# Patient Record
Sex: Male | Born: 1990 | Race: Black or African American | Hispanic: No | Marital: Single | Smoking: Current every day smoker
Health system: Southern US, Community
[De-identification: ages and names within clinical notes are randomized; demographics above are authoritative.]

---

## 2013-12-14 LAB — COMPREHENSIVE METABOLIC PANEL
ALBUMIN: 4.3 g/dL (ref 3.4–5.0)
ALK PHOS: 54 U/L
Anion Gap: 4 — ABNORMAL LOW (ref 7–16)
BILIRUBIN TOTAL: 1.2 mg/dL — AB (ref 0.2–1.0)
BUN: 10 mg/dL (ref 7–18)
CALCIUM: 9.2 mg/dL (ref 8.5–10.1)
CO2: 29 mmol/L (ref 21–32)
Chloride: 107 mmol/L (ref 98–107)
Creatinine: 0.83 mg/dL (ref 0.60–1.30)
EGFR (Non-African Amer.): >60
Glucose: 115 mg/dL — ABNORMAL HIGH (ref 65–99)
OSMOLALITY: 279 (ref 275–301)
Potassium: 3.6 mmol/L (ref 3.5–5.1)
SGOT(AST): 18 U/L (ref 15–37)
SGPT (ALT): 25 U/L (ref 12–78)
Sodium: 140 mmol/L (ref 136–145)
TOTAL PROTEIN: 8.1 g/dL (ref 6.4–8.2)

## 2013-12-14 LAB — CBC
HCT: 42.6 % (ref 40.0–52.0)
HGB: 13.9 g/dL (ref 13.0–18.0)
MCH: 29.9 pg (ref 26.0–34.0)
MCHC: 32.7 g/dL (ref 32.0–36.0)
MCV: 91 fL (ref 80–100)
Platelet: 192 10*3/uL (ref 150–440)
RBC: 4.66 10*6/uL (ref 4.40–5.90)
RDW: 12.2 % (ref 11.5–14.5)
WBC: 10.2 10*3/uL (ref 3.8–10.6)

## 2013-12-14 LAB — ETHANOL
Ethanol %: <0.003 % (ref 0.000–0.080)
Ethanol: <3 mg/dL

## 2013-12-14 LAB — SALICYLATE LEVEL: Salicylates, Serum: <1.7 mg/dL

## 2013-12-14 LAB — ACETAMINOPHEN LEVEL: Acetaminophen: <2 ug/mL

## 2013-12-15 ENCOUNTER — Inpatient Hospital Stay: Payer: Self-pay | Admitting: Psychiatry

## 2013-12-15 LAB — URINALYSIS, COMPLETE
BACTERIA: NONE SEEN
Bilirubin,UR: NEGATIVE
Blood: NEGATIVE
GLUCOSE, UR: NEGATIVE mg/dL (ref 0–75)
KETONE: NEGATIVE
Leukocyte Esterase: NEGATIVE
Nitrite: NEGATIVE
Ph: 5 (ref 4.5–8.0)
Protein: NEGATIVE
Specific Gravity: 1.029 (ref 1.003–1.030)
Squamous Epithelial: <1

## 2013-12-15 LAB — DRUG SCREEN, URINE
Amphetamines, Ur Screen: NEGATIVE (ref ?–1000)
BARBITURATES, UR SCREEN: NEGATIVE (ref ?–200)
BENZODIAZEPINE, UR SCRN: NEGATIVE (ref ?–200)
COCAINE METABOLITE, UR ~~LOC~~: NEGATIVE (ref ?–300)
Cannabinoid 50 Ng, Ur ~~LOC~~: NEGATIVE (ref ?–50)
MDMA (ECSTASY) UR SCREEN: NEGATIVE (ref ?–500)
Methadone, Ur Screen: NEGATIVE (ref ?–300)
Opiate, Ur Screen: NEGATIVE (ref ?–300)
PHENCYCLIDINE (PCP) UR S: NEGATIVE (ref ?–25)
Tricyclic, Ur Screen: NEGATIVE (ref ?–1000)

## 2014-12-13 NOTE — H&P (Signed)
PATIENT NAMTacy Medina:  Medina, [name] MR#:  [id number] DATE OF BIRTH:  July 04, 1991  DATE OF ADMISSION:  12/15/2013  REFERRING PHYSICIAN: Emergency Room MD  ATTENDING PHYSICIAN: Kristine LineaJolanta Shaine Newmark, MD   IDENTIFYING DATA: [name] is a 24 year old male with no past psychiatric history.   CHIEF COMPLAINT: "I was upset."   HISTORY OF PRESENT ILLNESS: [name] came to the Emergency Room upset after an argument with his girlfriend when he was thinking of suicide. He did not have a specific plan but was unable to contact for safety, rather upset and hopeless, worthless, guilty. He reports that he has been with his girlfriend for 3 years. They have been arguing on and off. Two weeks ago following an argument with the girlfriend he left [place] and wants to [place] to stay with his mother where he feels supported. As a result he lost his job, and is unemployed now. He has been arguing with his girlfriend even more. He used to give her $150 of her paycheck every 2 weeks now. Now he has no money to pay the pills or support the girl. She reportedly is very jealous of any conversations that he may have with other women. He says he has been faithful. He reports that his whole family is probably bipolar and he experiences mood swings from day to day, unexpected anger, impulsive behavior, hyperactivity, racing thoughts, difficulties falling asleep, excessive worries. He has been thinking that he might be bipolar and would gladly welcome medications to address his mood swings. He has some symptoms of depression with decreased sleep, anhedonia, feeling of guilt, hopelessness, worthlessness, and now suicidal ideation. He denies any problems with memory, concentration, or energy. He denies psychotic symptoms. He denies symptoms suggestive of bipolar mild anemia. He denies alcohol or illicit substance use.   PAST PSYCHIATRIC HISTORY: None reported. No hospitalizations treatment or suicide attempts. No history of substance  use.    FAMILY PSYCHIATRIC HISTORY: Sister and brother with depression, many people in his opinion with bipolar disorder.   PAST MEDICAL HISTORY: None.   ALLERGIES: No known drug allergies.   MEDICATIONS ON ADMISSION: None.   SOCIAL HISTORY: He dropped out of school in tenth grade, works through a [employer] agency. He lives with his brother and they are dating sisters so all 4 of them stays in an apartment. Usually they have a good relationship lately the patient has been losing his composure. He does not have a GED, but would like to go back to school. His major support is his mother in [place].   REVIEW OF SYSTEMS:  CONSTITUTIONAL: No fevers or chills. No weight changes.  EYES: No double or blurred vision.  ENT: No hearing loss.  RESPIRATORY: No shortness of breath or cough.  CARDIOVASCULAR: No chest pain or orthopnea.  GASTROINTESTINAL: No abdominal pain, nausea, vomiting, or diarrhea.  GENITOURINARY: No incontinence or frequency.  ENDOCRINE: No heat or cold intolerance.  LYMPHATIC: No anemia or easy bruising.  INTEGUMENTARY: No acne or rash.  MUSCULOSKELETAL: No muscle or joint pain.  NEUROLOGIC: No tingling or weakness.  PSYCHIATRIC: See history of present illness for details.   PHYSICAL EXAMINATION:  VITAL SIGNS: Blood pressure 123/79, pulse 49, respirations 18, temperature 98.3.  GENERAL: This is a well-developed young male in no acute distress.  HEENT: The pupils are equal, round, and reactive to light. Sclerae anicteric.  NECK: Supple. No thyromegaly.  LUNGS: Clear to auscultation. No dullness to percussion.  HEART: Regular rhythm and rate. No murmurs, rubs, or gallops.  ABDOMEN: Soft, nontender, nondistended. Positive bowel sounds.  MUSCULOSKELETAL: Normal muscle strength in all extremities.  SKIN: No rashes or bruises.  LYMPHATIC: No cervical adenopathy.  NEUROLOGIC: Cranial nerves II-XII are intact.   LABORATORY DATA: Chemistries are within normal limits with glucose  of 115. Blood and alcohol level is zero. LFTs within normal limits except for total bilirubin of 1.2. Urine tox screen negative for substances. CBC within normal limits. Urinalysis is not suggestive of urinary tract infection. Serum acetaminophen and salicylate are low.   MENTAL STATUS EXAMINATION ON ADMISSION: The patient is alert and oriented to person, place, time, and situation. He is pleasant, polite, and cooperative. He is well groomed and casually dressed. He maintains good eye contact. His speech is of normal rhythm, rate, and volume. He is slightly tearful talking about his troubles. His mood is depressed with a sad affect. Thought process is logical and goal oriented. Thought content: He still had passing thoughts of suicide, but is able to contract for safety. He denies thoughts of hurting others. There are no delusions or paranoia. There are no auditory or visual hallucinations. His cognition is grossly intact. His registration, recall, short and long-term memory and are intact. He is of average intelligence and normal fund of knowledge. His insight and judgment are fair. He is a good historian. Suicide risk assessment on admission: This is a patient with no past psychiatric history, but some symptoms suggestive of mood instability, who became suicidal in the context of relationship problems and job loss. He is at increased risk of suicide.   INITIAL DIAGNOSES:    AXIS I: Bipolar disorder, not otherwise specified.   AXIS II: Deferred.   AXIS III: Deferred.   AXIS IV: Employment, financial, relationship, access to care.   AXIS V: Global assessment of functioning 25.   PLAN: The patient was admitted to [facility] Medicine unit for safety, stabilization, and medication management. He was initially placed on suicide precautions and was closely monitored for any unsafe behaviors. He underwent full psychiatric and risk assessment. He received pharmacotherapy,  individual and group psychotherapy, substance abuse counseling, and support from therapeutic milieu.  1. Suicidal ideation: The patient is able to contract for safety,  2. Mood: We will start Tegretol for mood stabilization and Prozac for depression. Trazodone will be added for sleep.  3. Disposition: The patient will be discharged to home. He will likely follow up with [facility] for and medication management and therapy.    ____________________________ Ellin Goodie. Jennet Maduro, MD jbp:lt D: 12/15/2013 20:33:36 ET T: 12/15/2013 23:43:43 ET JOB#: [id number]  cc: Natash Berman B. Jennet Maduro, MD, <Dictator> Shari Prows MD ELECTRONICALLY SIGNED 12/18/2013 23:52

## 2015-05-12 ENCOUNTER — Encounter: Payer: Self-pay | Admitting: Emergency Medicine

## 2015-05-12 ENCOUNTER — Emergency Department
Admission: EM | Admit: 2015-05-12 | Discharge: 2015-05-12 | Disposition: A | Discharge status: Home / Self Care | Payer: Self-pay | Attending: Emergency Medicine | Admitting: Emergency Medicine

## 2015-05-12 ENCOUNTER — Emergency Department: Payer: Self-pay

## 2015-05-12 DIAGNOSIS — Z87891 Personal history of nicotine dependence: Secondary | ICD-10-CM | POA: Insufficient documentation

## 2015-05-12 DIAGNOSIS — K047 Periapical abscess without sinus: Secondary | ICD-10-CM | POA: Insufficient documentation

## 2015-05-12 LAB — CBC WITH DIFFERENTIAL/PLATELET
Basophils Absolute: 0.1 10*3/uL (ref 0–0.1)
Basophils Relative: 1 %
Eosinophils Absolute: 0.3 10*3/uL (ref 0–0.7)
Eosinophils Relative: 3 %
HEMATOCRIT: 42.3 % (ref 40.0–52.0)
HEMOGLOBIN: 14.1 g/dL (ref 13.0–18.0)
LYMPHS ABS: 2.4 10*3/uL (ref 1.0–3.6)
LYMPHS PCT: 22 %
MCH: 30.6 pg (ref 26.0–34.0)
MCHC: 33.3 g/dL (ref 32.0–36.0)
MCV: 91.8 fL (ref 80.0–100.0)
MONOS PCT: 9 %
Monocytes Absolute: 1 10*3/uL (ref 0.2–1.0)
NEUTROS ABS: 7.2 10*3/uL — AB (ref 1.4–6.5)
Neutrophils Relative %: 65 %
Platelets: 167 10*3/uL (ref 150–440)
RBC: 4.61 MIL/uL (ref 4.40–5.90)
RDW: 12.4 % (ref 11.5–14.5)
WBC: 11 10*3/uL — ABNORMAL HIGH (ref 3.8–10.6)

## 2015-05-12 LAB — BASIC METABOLIC PANEL
ANION GAP: 7 (ref 5–15)
BUN: 12 mg/dL (ref 6–20)
CHLORIDE: 102 mmol/L (ref 101–111)
CO2: 28 mmol/L (ref 22–32)
Calcium: 9.3 mg/dL (ref 8.9–10.3)
Creatinine, Ser: 1.22 mg/dL (ref 0.61–1.24)
GFR calc Af Amer: >60 mL/min (ref >60)
GFR calc non Af Amer: >60 mL/min (ref >60)
GLUCOSE: 90 mg/dL (ref 65–99)
Potassium: 4.1 mmol/L (ref 3.5–5.1)
Sodium: 137 mmol/L (ref 135–145)

## 2015-05-12 MED ORDER — IBUPROFEN 800 MG PO TABS: 800.0000 mg | ORAL_TABLET | Freq: Three times a day (TID) | ORAL | Status: DC | PRN

## 2015-05-12 MED ORDER — TRAMADOL HCL 50 MG PO TABS: 50.0000 mg | ORAL_TABLET | Freq: Four times a day (QID) | ORAL | Status: AC | PRN

## 2015-05-12 MED ORDER — IOHEXOL 300 MG/ML  SOLN
75.0000 mL | Freq: Once | INTRAMUSCULAR | Status: AC | PRN
  Administered 2015-05-12: 75 mL via INTRAVENOUS

## 2015-05-12 MED ORDER — IBUPROFEN 800 MG PO TABS
800.0000 mg | ORAL_TABLET | Freq: Once | ORAL | Status: AC
  Administered 2015-05-12: 800 mg via ORAL
  Filled 2015-05-12: qty 1

## 2015-05-12 MED ORDER — AMOXICILLIN 500 MG PO CAPS: 500.0000 mg | ORAL_CAPSULE | Freq: Three times a day (TID) | ORAL | Status: DC

## 2015-05-12 MED ORDER — OXYCODONE-ACETAMINOPHEN 5-325 MG PO TABS
1.0000 | ORAL_TABLET | Freq: Once | ORAL | Status: AC
  Administered 2015-05-12: 1 via ORAL
  Filled 2015-05-12: qty 1

## 2015-05-12 MED ORDER — AMOXICILLIN 500 MG PO CAPS
500.0000 mg | ORAL_CAPSULE | Freq: Once | ORAL | Status: AC
  Administered 2015-05-12: 500 mg via ORAL
  Filled 2015-05-12: qty 1

## 2015-05-12 NOTE — Discharge Instructions (Signed)
Follow up with dental clinic from list provided. OPTIONS FOR DENTAL FOLLOW UP CARE  [place] Department of Health and Human Services - Local Safety Net Dental Clinics [email or web address]   [facility] [phone])  Sharl Ma [phone])  [place] [phone])  [facility] [phone])  [facility] [phone]) This clinic caters to the indigent population and is on a lottery system. Location: [employer] of Dentistry, [employer], [address] [place] Clinic Hours: Wednesdays from 6pm - 9pm, patients seen by a lottery system. For dates, call or go to [email or web address] Services: Cleanings, fillings and simple extractions. Payment Options: DENTAL WORK IS FREE OF CHARGE. Bring proof of income or support. Best way to get seen: Arrive at 5:15 pm - this is a lottery, NOT first come/first serve, so arriving earlier will not increase your chances of being seen.     [facility] [phone] Select option 1 for emergencies   Location: [facility] of Dentistry, [place], [address] [place] Clinic Hours: No walk-ins accepted - call the day before to schedule an appointment. Check in times are 9:30 am and 1:30 pm. Services: Simple extractions, temporary fillings, pulpectomy/pulp debridement, uncomplicated abscess drainage. Payment Options: PAYMENT IS DUE AT THE TIME OF SERVICE.  Fee is usually $100-200, additional surgical procedures (e.g. abscess drainage) may be extra. Cash, checks, Visa/MasterCard accepted.  Can file Medicaid if patient is covered for dental - patient should call case worker to check. No discount for [facility] patients. Best way to get seen: MUST call the day before and get onto the schedule. Can usually be seen the next 1-2 days. No walk-ins accepted.      [facility] Services [phone]   Location: [facility], [address] [place] Clinic Hours: M, W, Th, F 8am or 1:30pm, Tues 9a or 1:30 - first come/first served. Services: Simple extractions, temporary fillings, uncomplicated abscess drainage.  You do not need to be an [facility] resident. Payment Options: PAYMENT IS DUE AT THE TIME OF SERVICE. Dental insurance, otherwise sliding scale - bring proof of income or support. Depending on income and treatment needed, cost is usually $50-200. Best way to get seen: Arrive early as it is first come/first served.     [facility] [phone]   Location: [address] Clinic Hours: Mon-Thu 8a-5p Services: Most basic dental services including extractions and fillings. Payment Options: PAYMENT IS DUE AT THE TIME OF SERVICE. Sliding scale, up to 50% off - bring proof if income or support. Medicaid with dental option accepted. Best way to get seen: Call to schedule an appointment, can usually be seen within 2 weeks OR they will try to see walk-ins - show up at 8a or 2p (you may have to wait).     [facility] [phone] [place]ID[place] ONLY   Location: [facility], [address], [place], [place] [address] Clinic Hours: By appointment only. Monday - Thursday 8am-5pm, Friday 8am-12pm Services: Cleanings, fillings, extractions. Payment Options: PAYMENT IS DUE AT THE TIME OF SERVICE. Cash, Visa or MasterCard. Sliding scale - $30 minimum per service. Best way to get seen: Come in to office, complete packet and make an appointment - need proof of income or support monies for each household member and proof of [facility] residence. Usually takes about a month to get in.     [facility] Dental  Clinic °[phone] °  °Location: °[address] [place] °Clinic Hours: Walk-in Urgent Care  Dental Services are offered Monday-Friday mornings only. °The numbers of emergencies accepted daily is limited to the number of °providers available. °Maximum 15 - Mondays, Wednesdays & Thursdays °Maximum 10 - Tuesdays & Fridays °Services: °You do not need to be a [place] resident to be seen for a dental emergency. °Emergencies are defined as pain, swelling, abnormal bleeding, or dental trauma. Walkins will receive x-rays if needed. °NOTE: Dental cleaning is not an emergency. °Payment Options: °PAYMENT IS DUE AT THE TIME OF SERVICE. °Minimum co-pay is $40.00 for uninsured patients. °Minimum co-pay is $3.00 for Medicaid with dental coverage. °Dental Insurance is accepted and must be presented at time of visit. °Medicare does not cover dental. °Forms of payment: Cash, credit card, checks. °Best way to get seen: °If not previously registered with the clinic, walk-in dental registration begins at 7:15 am and is on a first come/first serve basis. °If previously registered with the clinic, call to make an appointment. °  °  °[facility] °[phone] °[place] RESIDENTS ONLY °  °Location: °[address], [place], [place] °Clinic Hours: °Mon-Thu 10a-2p °Services: Extractions only! °Payment Options: °FREE (donations accepted) - bring proof of income or support °Best way to get seen: °Call and schedule an appointment OR come at 8am on the 1st Monday of every month (except for holidays) when it is first come/first served. °  °  °[facility] °[phone] °  °Location: °[address], [place] °Clinic Hours: °Friday mornings °Services, Payment Options, Best way to get seen: °Call for info ° °

## 2015-05-12 NOTE — ED Provider Notes (Signed)
[facility] Emergency Department Fronie Holstein Note  ____________________________________________  Time seen: Approximately 7:20 PM  I have reviewed the triage vital signs and the nursing notes.   HISTORY  Chief Complaint Facial Swelling    HPI [name] is a 24 y.o. male left facial edema acute onset this morning. Patient denies any dental pain. He denies any fever associated with this complaint. Patient on dialysis he has a lot of badteethbegan denies any dental pain. Patient also state he feels pressure in the left maxillary area. Denies any nasal discharge. measures taken for this complaint.   History reviewed. No pertinent past medical history.  There are no active problems to display for this patient.   History reviewed. No pertinent past surgical history.  No current outpatient prescriptions on file.  Allergies Review of patient's allergies indicates no known allergies.  No family history on file.  Social History Social History  Substance Use Topics  . Smoking status: Former Games developer  . Smokeless tobacco: None  . Alcohol Use: No    Review of Systems Constitutional: No fever/chills Eyes: No visual changes. ENT: No sore throat. Cardiovascular: Denies chest pain. Respiratory: Denies shortness of breath. Gastrointestinal: No abdominal pain.  No nausea, no vomiting.  No diarrhea.  No constipation. Genitourinary: Negative for dysuria. Musculoskeletal: Negative for back pain. Skin: Negative for rash. Left facial edema Neurological: Negative for headaches, focal weakness or numbness. 10-point ROS otherwise negative.  ____________________________________________   PHYSICAL EXAM:  VITAL SIGNS: ED Triage Vitals  Enc Vitals Group     BP 05/12/15 1833 136/86 mmHg     Pulse Rate 05/12/15 1833 70     Resp 05/12/15 1833 20     Temp 05/12/15 1833 98.7 F (37.1 C)     Temp Source 05/12/15 1833 Oral     SpO2 05/12/15 1833 98 %     Weight  05/12/15 1833 225 lb (102.059 kg)     Height 05/12/15 1833  (1.803 m)     Head Cir --      Peak Flow --      Pain Score --      Pain Loc --      Pain Edu? --      Excl. in GC? --     Constitutional: Alert and oriented. Well appearing and in no acute distress. Eyes: Conjunctivae are normal. PERRL. EOMI. Head: Atraumatic. Nose: No congestion/rhinnorhea. Mouth/Throat: Mucous membranes are moist.  Oropharynx non-erythematous. Neck: No stridor.   Hematological/Lymphatic/Immunilogical: No cervical lymphadenopathy. Cardiovascular: Normal rate, regular rhythm. Grossly normal heart sounds.  Good peripheral circulation. Respiratory: Normal respiratory effort.  No retractions. Lungs CTAB. Gastrointestinal: Soft and nontender. No distention. No abdominal bruits. No CVA tenderness. Musculoskeletal: No lower extremity tenderness nor edema.  No joint effusions. Neurologic:  Normal speech and language. No gross focal neurologic deficits are appreciated. No gait instability. Skin:  Skin is warm, dry and intact. No rash noted. Psychiatric: Mood and affect are normal. Speech and behavior are normal.  ____________________________________________   LABS (all labs ordered are listed, but only abnormal results are displayed)  Labs Reviewed  CBC WITH DIFFERENTIAL/PLATELET - Abnormal; Notable for the following:    WBC 11.0 (*)    Neutro Abs 7.2 (*)    All other components within normal limits  BASIC METABOLIC PANEL   ____________________________________________  EKG   ____________________________________________  RADIOLOGY  CT scan consistent with a dental abscess. ____________________________________________   PROCEDURES  Procedure(s) performed: None  Critical Care performed: No  ____________________________________________   INITIAL IMPRESSION / ASSESSMENT AND PLAN / ED COURSE  Pertinent labs & imaging results that were available during my care of the patient were reviewed  by me and considered in my medical decision making (see chart for details).  Discuss CT findings with patient consistent with a dental abscess. Patient was started on amoxicillin, ibuprofen, and tramadol. Patient advised follow-up with dental clinic from the list provided. ___________________OPTIONS FOR DENTAL FOLLOW UP CARE  [place] Department of Health and Human Services - Local Safety Net Dental Clinics [email or web address]   [facility] [phone])  Sharl Ma [phone])  [facility] [place] [phone])  [facility] [phone])  [facility] [phone]) This clinic caters to the indigent population and is on a lottery system. Location: [employer] [facility], [employer], [address], [place] Clinic Hours: Wednesdays from 6pm - 9pm, patients seen by a lottery system. For dates, call or go to [email or web address] Services: Cleanings, fillings and simple extractions. Payment Options: DENTAL WORK IS FREE OF CHARGE. Bring proof of income or support. Best way to get seen: Arrive at 5:15 pm - this is a lottery, NOT first come/first serve, so arriving earlier will not increase your chances of being seen.     [facility] [phone] Select option 1 for emergencies   Location: [facility] of Dentistry, [place], [address], [place] Clinic Hours: No walk-ins accepted - call the day before to schedule an appointment. Check in times are 9:30 am and 1:30 pm. Services: Simple extractions, temporary fillings, pulpectomy/pulp debridement, uncomplicated abscess drainage. Payment Options: PAYMENT IS DUE AT THE TIME OF SERVICE.  Fee is usually $100-200, additional surgical procedures (e.g. abscess drainage) may be extra. Cash, checks, Visa/MasterCard accepted.  Can file Medicaid if patient is covered  for dental - patient should call case worker to check. No discount for [facility] patients. Best way to get seen: MUST call the day before and get onto the schedule. Can usually be seen the next 1-2 days. No walk-ins accepted.     [facility] Services [phone]   Location: [facility], [address], [place] Clinic Hours: M, W, Th, F 8am or 1:30pm, Tues 9a or 1:30 - first come/first served. Services: Simple extractions, temporary fillings, uncomplicated abscess drainage.  You do not need to be an [facility] resident. Payment Options: PAYMENT IS DUE AT THE TIME OF SERVICE. Dental insurance, otherwise sliding scale - bring proof of income or support. Depending on income and treatment needed, cost is usually $50-200. Best way to get seen: Arrive early as it is first come/first served.     [facility] [phone]   Location: [address] Clinic Hours: Mon-Thu 8a-5p Services: Most basic dental services including extractions and fillings. Payment Options: PAYMENT IS DUE AT THE TIME OF SERVICE. Sliding scale, up to 50% off - bring proof if income or support. Medicaid with dental option accepted. Best way to get seen: Call to schedule an appointment, can usually be seen within 2 weeks OR they will try to see walk-ins - show up at 8a or 2p (you may have to wait).     [facility] [phone] [place]ID[place] ONLY   Location: [facility], [address], [place], [place] [address] Clinic Hours: By appointment only. Monday - Thursday 8am-5pm, Friday 8am-12pm Services: Cleanings, fillings, extractions. Payment Options: PAYMENT IS DUE AT  THE TIME OF SERVICE. Cash, Visa or MasterCard. Sliding scale - $30 minimum per service. Best way to get seen: Come in to office, complete packet and make an appointment - need proof of income or support monies for  each household member and proof of [facility] residence. Usually takes about a month to get in.     [facility] [phone]   Location: [address], [facility] Clinic Hours: Walk-in Urgent Care Dental Services are offered Monday-Friday mornings only. The numbers of emergencies accepted daily is limited to the number of providers available. Maximum 15 - Mondays, Wednesdays & Thursdays Maximum 10 - Tuesdays & Fridays Services: You do not need to be a [facility] resident to be seen for a dental emergency. Emergencies are defined as pain, swelling, abnormal bleeding, or dental trauma. Walkins will receive x-rays if needed. NOTE: Dental cleaning is not an emergency. Payment Options: PAYMENT IS DUE AT THE TIME OF SERVICE. Minimum co-pay is $40.00 for uninsured patients. Minimum co-pay is $3.00 for Medicaid with dental coverage. Dental Insurance is accepted and must be presented at time of visit. Medicare does not cover dental. Forms of payment: Cash, credit card, checks. Best way to get seen: If not previously registered with the clinic, walk-in dental registration begins at 7:15 am and is on a first come/first serve basis. If previously registered with the clinic, call to make an appointment.     [facility] [phone] [place] RESIDENTS ONLY   Location: [address], [place], [place] Clinic Hours: Mon-Thu 10a-2p Services: Extractions only! Payment Options: FREE (donations accepted) - bring proof of income or support Best way to get seen: Call and schedule an appointment OR come at 8am on the 1st Monday of every month (except for holidays) when it is first come/first served.     [facility] [phone]   Location: [address] [place], [place] Clinic Hours: Friday mornings Services, Payment Options, Best way to get seen: Call for info _________________________   FINAL CLINICAL IMPRESSION(S) / ED  DIAGNOSES  Final diagnoses:  Dental abscess      Joni Reining, PA-C 05/12/15 2224  Loleta Rose, MD 05/13/15 [phone]

## 2015-05-12 NOTE — ED Notes (Signed)
Woke up with facial swelling this am  denies any pain

## 2016-06-14 IMAGING — CT CT MAXILLOFACIAL W/ CM
3 series · 16 of 47 positions shown, 19 images · IV contrast (omnipaque)
Comparison: None.

CLINICAL DATA: 24-year-old male with left-sided facial swelling.

EXAM:
CT MAXILLOFACIAL WITH CONTRAST
TECHNIQUE: Multidetector CT imaging of the maxillofacial structures was
performed with intravenous contrast. Multiplanar CT image
reconstructions were also generated. A small metallic BB was placed
on the right temple in order to reliably differentiate right from
left.
CONTRAST:  75mL OMNIPAQUE IOHEXOL 300 MG/ML  SOLN

[Series 2: max soft · axial · 0.32mm/px · z∈[-186,-38]mm · 10 of 86 slices shown, 13 images]
[im 6/86  brain]
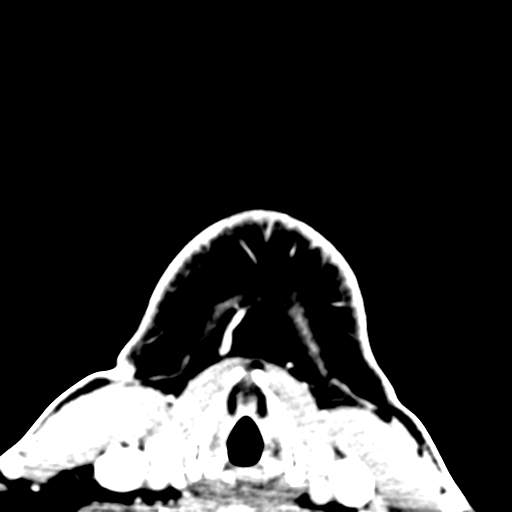
[im 6/86  bone]
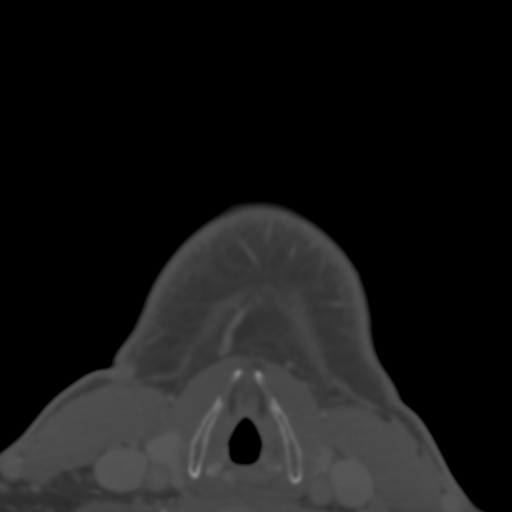
[im 15/86  bone]
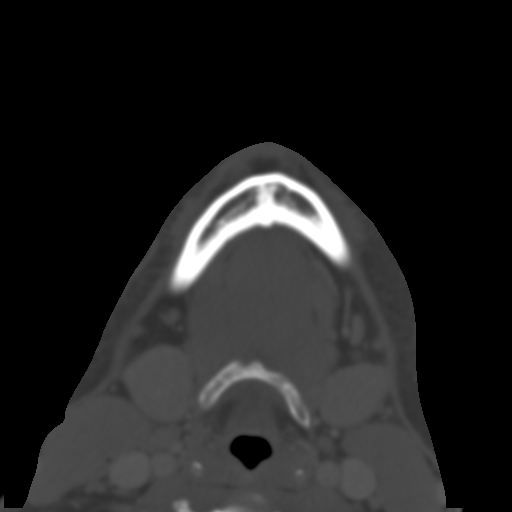
[im 24/86  bone]
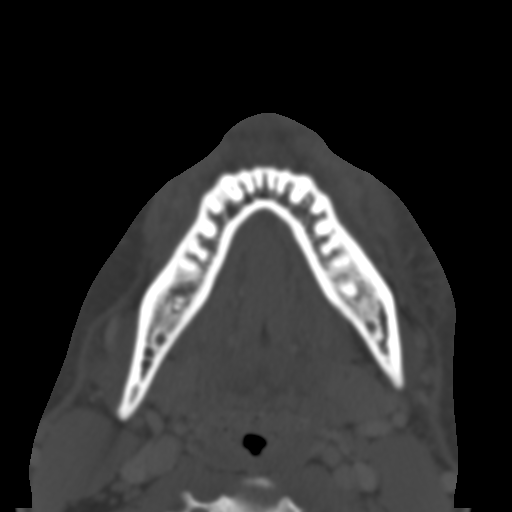
[im 30/86  bone]
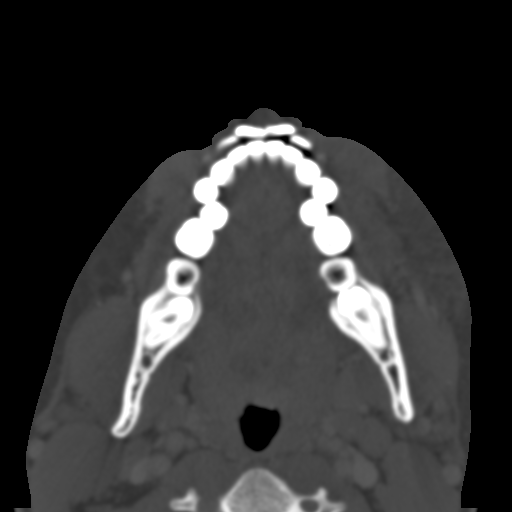
[im 39/86  brain]
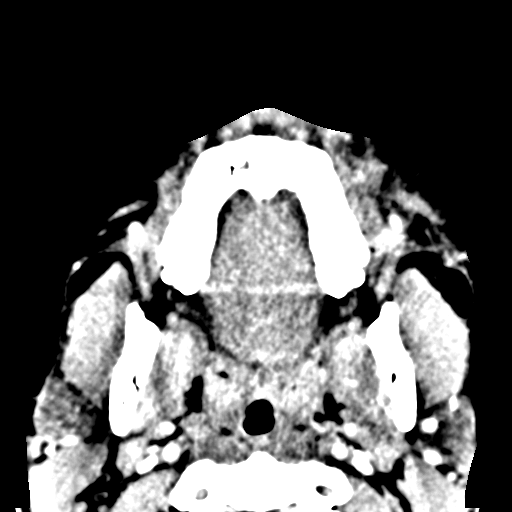
[im 39/86  bone]
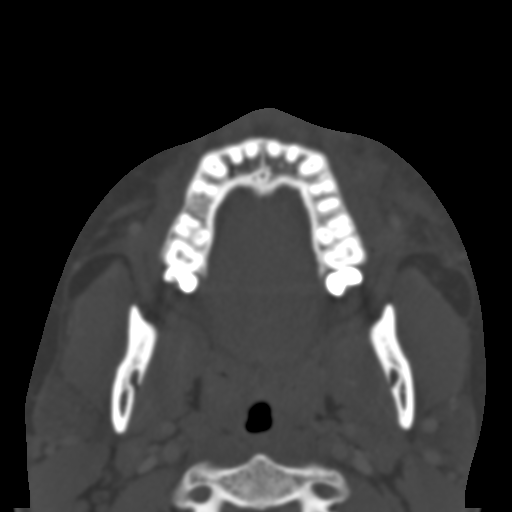
[im 47/86  bone]
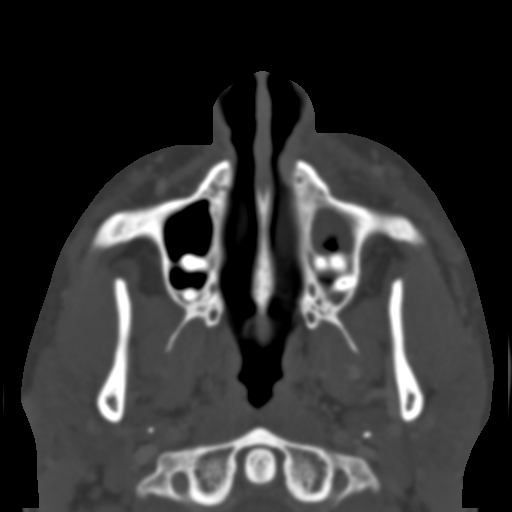
[im 56/86  bone]
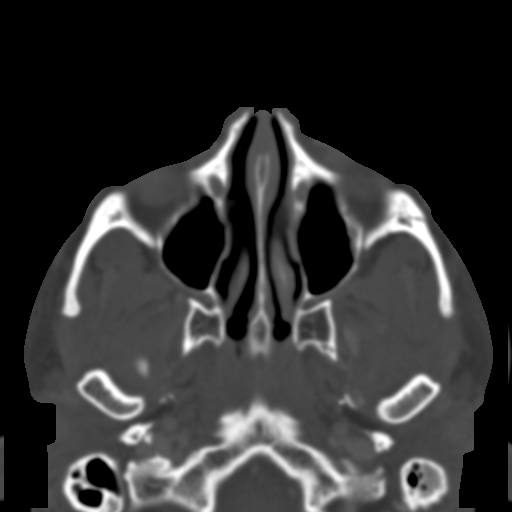
[im 65/86  bone]
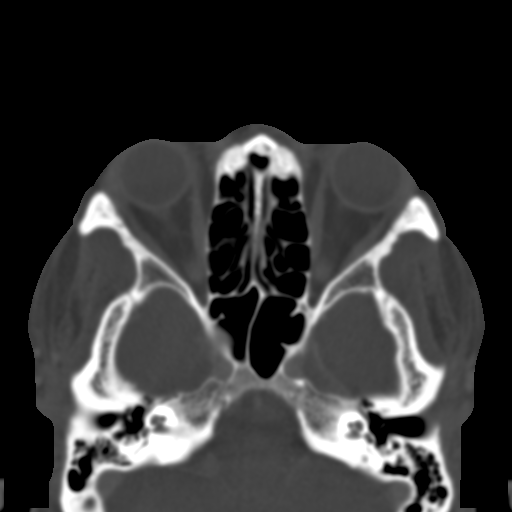
[im 71/86  brain]
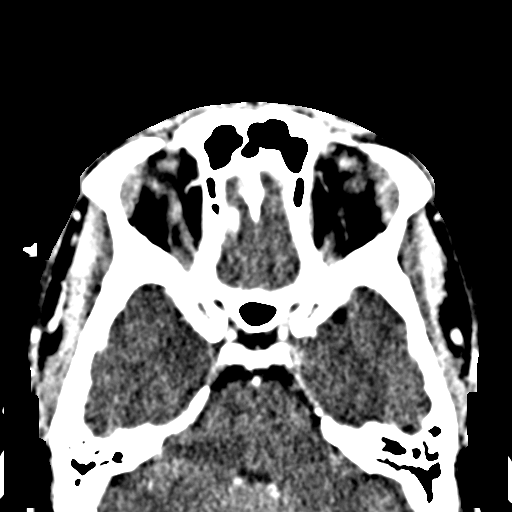
[im 71/86  bone]
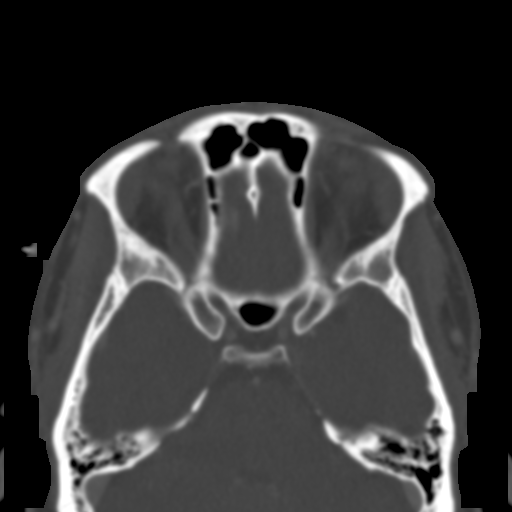
[im 80/86  bone]
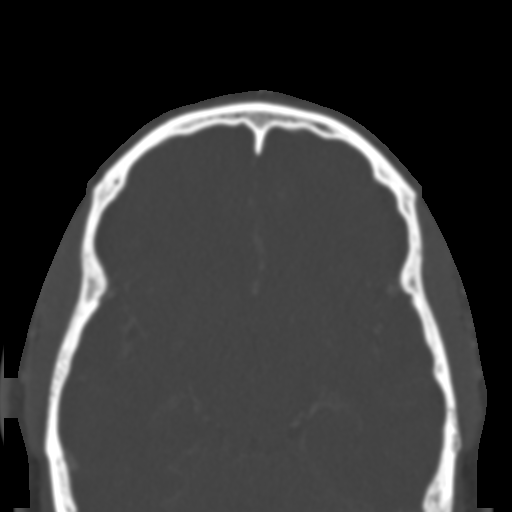

[Series 4: coronal soft · coronal · 0.35mm/px · 3 of 81 slices shown]
[im 27/81  bone]
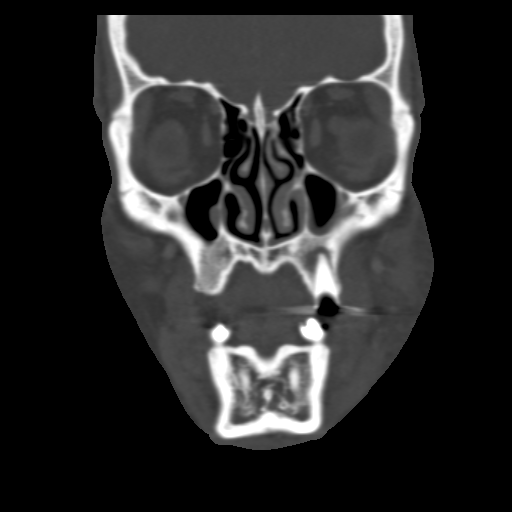
[im 36/81  bone]
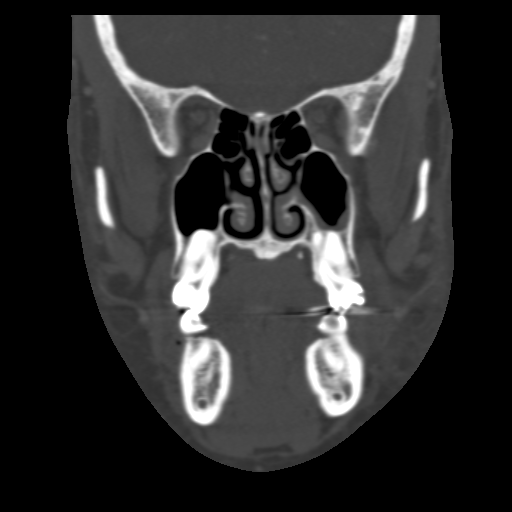
[im 45/81  bone]
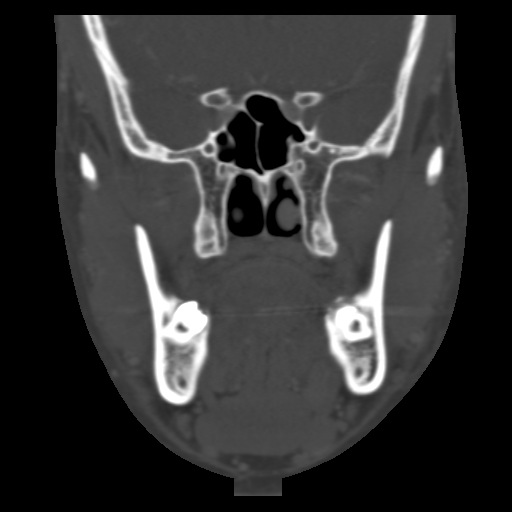

[Series 5: sagittal soft · sagittal · 0.35mm/px · 3 of 81 slices shown]
[im 27/81  bone]
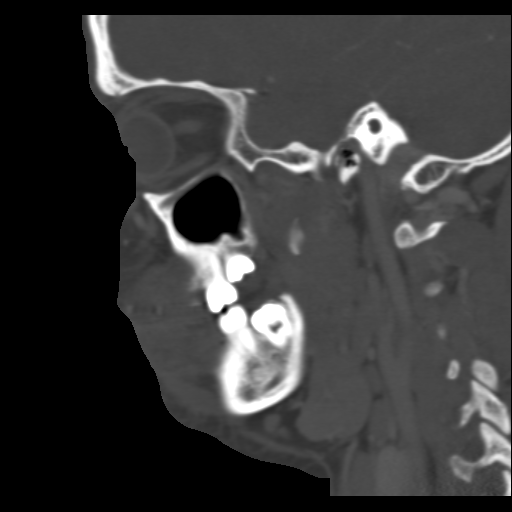
[im 41/81  bone]
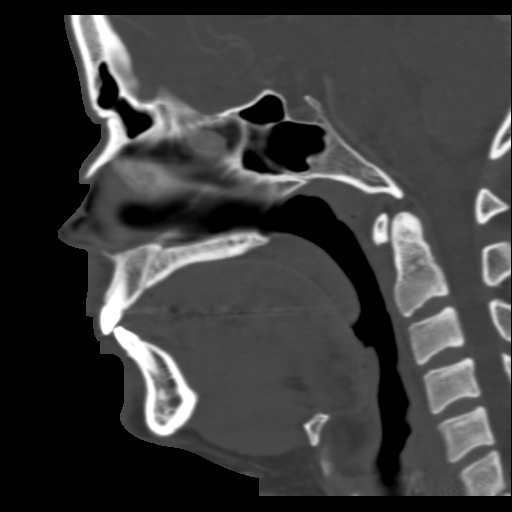
[im 54/81  bone]
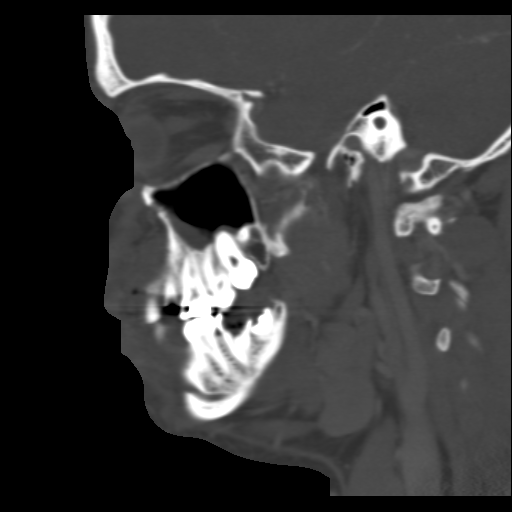

[16 of 47 positions shown; findings below may reference images not displayed]

FINDINGS: The visualized intracranial contents are unremarkable. The bilateral
globes and orbits are symmetric and unremarkable. There is mild skin
thickening and reticulation of the subcutaneous fat involving the
left cheek and underlying soft tissues. There is no associated
enhancing fluid collection to suggest abscess.

The visualized salivary glands are within normal limits.
Unremarkable visualized naso and oropharynx. Mild mucoperiosteal
thickening in the inferior aspect of the left maxillary antrum.

Large periapical lucency about the root of the carious left
maxillary second premolar. This may represent a source of
odontogenic infection. There are multiple carious teeth bilaterally.
IMPRESSION: 1. Skin thickening and reticulation of the underlying subcutaneous
fat and soft tissues of the left cheek and a GI all most consistent
with cellulitis. There is no associated abscess.
2. Carious left maxillary second premolar with large periapical
lucency consistent with periodontal disease. This may represent an
odontogenic source for the above described cellulitis.
3. Multiple carious teeth bilaterally.

## 2019-04-04 ENCOUNTER — Encounter: Payer: Self-pay | Admitting: Emergency Medicine

## 2019-04-04 ENCOUNTER — Other Ambulatory Visit: Payer: Self-pay

## 2019-04-04 ENCOUNTER — Emergency Department
Admission: EM | Admit: 2019-04-04 | Discharge: 2019-04-04 | Disposition: A | Discharge status: Home / Self Care | Payer: Self-pay | Attending: Emergency Medicine | Admitting: Emergency Medicine

## 2019-04-04 DIAGNOSIS — Z0189 Encounter for other specified special examinations: Secondary | ICD-10-CM | POA: Insufficient documentation

## 2019-04-04 DIAGNOSIS — Z7689 Persons encountering health services in other specified circumstances: Secondary | ICD-10-CM

## 2019-04-04 DIAGNOSIS — Z87891 Personal history of nicotine dependence: Secondary | ICD-10-CM | POA: Insufficient documentation

## 2019-04-04 DIAGNOSIS — L0231 Cutaneous abscess of buttock: Secondary | ICD-10-CM | POA: Insufficient documentation

## 2019-04-04 MED ORDER — SULFAMETHOXAZOLE-TRIMETHOPRIM 800-160 MG PO TABS
1.0000 | ORAL_TABLET | Freq: Once | ORAL | Status: AC
  Administered 2019-04-04: 1 via ORAL
  Filled 2019-04-04: qty 1

## 2019-04-04 MED ORDER — SULFAMETHOXAZOLE-TRIMETHOPRIM 800-160 MG PO TABS: 1.0000 | ORAL_TABLET | Freq: Two times a day (BID) | ORAL | 0 refills | Status: DC

## 2019-04-04 MED ORDER — LIDOCAINE HCL (PF) 1 % IJ SOLN
5.0000 mL | Freq: Once | INTRAMUSCULAR | Status: AC
  Administered 2019-04-04: 5 mL via INTRADERMAL
  Filled 2019-04-04: qty 5

## 2019-04-04 MED ORDER — HYDROCODONE-ACETAMINOPHEN 5-325 MG PO TABS: 1.0000 | ORAL_TABLET | Freq: Three times a day (TID) | ORAL | 0 refills | Status: AC | PRN

## 2019-04-04 MED ORDER — HYDROCODONE-ACETAMINOPHEN 5-325 MG PO TABS
1.0000 | ORAL_TABLET | Freq: Once | ORAL | Status: AC
  Administered 2019-04-04: 1 via ORAL
  Filled 2019-04-04: qty 1

## 2019-04-04 NOTE — ED Notes (Signed)
See triage note  Presents with possible abscess area to buttocks  Low grade temp on arrival  States he noticed area 2 days ago

## 2019-04-04 NOTE — ED Triage Notes (Signed)
abscess to buttock x 2 days.

## 2019-04-04 NOTE — ED Provider Notes (Signed)
[facility] Emergency Department Provider Note ____________________________________________  Time seen: 1807  I have reviewed the triage vital signs and the nursing notes.  HISTORY  Chief Complaint  Abscess  HPI [name] is a 28 y.o. male presents to the ED for evaluation of a 2-day complaint of tenderness and fullness to the right buttocks.  Patient reports tender abscess developing to the right abscess at the top of the gluteal cleft.  He denies any other symptoms at this time.  Patient presents unaware of his low-grade temp.  He is medically medications in the interim.  He presents now with pain, tenderness, and inability to sit comfortably on his bottom.   History reviewed. No pertinent past medical history.  There are no active problems to display for this patient.  History reviewed. No pertinent surgical history.  Prior to Admission medications   Medication Sig Start Date End Date Taking? Authorizing Provider  HYDROcodone-acetaminophen (NORCO) 5-325 MG tablet Take 1 tablet by mouth 3 (three) times daily as needed for up to 2 days. 04/04/19 04/06/19  Miken Stecher, Dannielle Karvonen, PA-C  sulfamethoxazole-trimethoprim (BACTRIM DS) 800-160 MG tablet Take 1 tablet by mouth 2 (two) times daily. 04/04/19   Erikka Follmer, Dannielle Karvonen, PA-C    Allergies Patient has no known allergies.  No family history on file.  Social History Social History   Tobacco Use  . Smoking status: Former Research scientist (life sciences)  . Smokeless tobacco: Never Used  Substance Use Topics  . Alcohol use: No  . Drug use: Not on file    Review of Systems  Constitutional: Negative for fever. Cardiovascular: Negative for chest pain. Respiratory: Negative for shortness of breath. Gastrointestinal: Negative for abdominal pain, vomiting and diarrhea. Genitourinary: Negative for dysuria. Musculoskeletal: Negative for back pain. Skin: Negative for rash. Right buttock abscess as above Neurological: Negative  for headaches, focal weakness or numbness. ____________________________________________  PHYSICAL EXAM:  VITAL SIGNS: ED Triage Vitals  Enc Vitals Group     BP 04/04/19 1710 137/64     Pulse Rate 04/04/19 1710 (!) 124     Resp 04/04/19 1710 18     Temp 04/04/19 1710 (!) 100.9 F (38.3 C)     Temp Source 04/04/19 1710 Oral     SpO2 04/04/19 1710 96 %     Weight 04/04/19 1707 225 lb 1.4 oz (102.1 kg)     Height --      Head Circumference --      Peak Flow --      Pain Score 04/04/19 1706 10     Pain Loc --      Pain Edu? --      Excl. in [place]? --     Constitutional: Alert and oriented. Well appearing and in no distress. Head: Normocephalic and atraumatic. Eyes: Conjunctivae are normal. Normal extraocular movements Cardiovascular: Normal rate, regular rhythm. Normal distal pulses. Respiratory: Normal respiratory effort.  Musculoskeletal: Nontender with normal range of motion in all extremities.  Neurologic:  Normal gait without ataxia. Normal speech and language. No gross focal neurologic deficits are appreciated. Skin:  Skin is warm, dry and intact. No rash noted.  Right buttocks with a pointing abscess noted to the top of the gluteal cleft.  No spontaneous drainage is noted.  The area is fluctuant and tender to palpation. ____________________________________________  PROCEDURES  Bactrim DS 1 PO Norco 5-325 mg PO  .Marland KitchenIncision and Drainage  Date/Time: 04/04/2019 7:21 PM Performed by: Melvenia Needles, PA-C Authorized by: Lillia Mountain  Marcelyn BruinsV Bacon, PA-C   Consent:    Consent obtained:  Verbal   Consent given by:  Patient   Risks discussed:  Bleeding, incomplete drainage and pain   Alternatives discussed:  Alternative treatment Location:    Type:  Abscess   Location:  Anogenital   Anogenital location:  Gluteal cleft Pre-procedure details:    Skin preparation:  Betadine Anesthesia (see MAR for exact dosages):    Anesthesia method:  Local infiltration   Local  anesthetic:  Lidocaine 1% w/o epi Procedure type:    Complexity:  Simple Procedure details:    Incision types:  Stab incision   Incision depth:  Subcutaneous   Scalpel blade:  11   Wound management:  Probed and deloculated and irrigated with saline   Drainage:  Purulent   Drainage amount:  Moderate   Packing materials:  1/4 in iodoform gauze   Amount 1/4" iodoform:  2 Post-procedure details:    Patient tolerance of procedure:  Tolerated well, no immediate complications  ____________________________________________  INITIAL IMPRESSION / ASSESSMENT AND PLAN / ED COURSE  [name] was evaluated in Emergency Department on 04/04/2019 for the symptoms described in the history of present illness. He was evaluated in the context of the global COVID-19 pandemic, which necessitated consideration that the patient might be at risk for infection with the SARS-CoV-2 virus that causes COVID-19. Institutional protocols and algorithms that pertain to the evaluation of patients at risk for COVID-19 are in a state of rapid change based on information released by regulatory bodies including the CDC and federal and state organizations. These policies and algorithms were followed during the patient's care in the ED.  Patient with ED evaluation management of a gluteal abscess.  The area is appropriately prepped and draped, and meaningful amount of purulent drainage is expressed.  The area is packed with iodoform dressing, and the patient is discharged with return instructions.  He is also given a prescription for Bactrim as well as hydrocodone for interim pain relief.  He and his girlfriend verbalized understanding of discharge instructions.  I reviewed the patient's prescription history over the last 12 months in the multi-state controlled substances database(s) that includes [place], [place], [place], [place], [place], [place], [place], [place], [place], [place], [place], and [place].  Results were notable for no RX history.  ____________________________________________  FINAL CLINICAL IMPRESSION(S) / ED DIAGNOSES  Final diagnoses:  Abscess of buttock, right  Encounter for incision and drainage procedure      Lissa HoardMenshew, Tacori Kvamme V Bacon, PA-C 04/04/19 1956    Dionne BucySiadecki, Sebastian, MD 04/04/19 2027

## 2019-04-04 NOTE — ED Notes (Signed)

## 2019-04-04 NOTE — Discharge Instructions (Signed)
Keep the area clean, dry, and covered. Take the antibiotic as directed and the pain medicine as needed. Follow-up with a local urgent care or return to the ED as needed. Remove the packing and/or return to the ED in 3 days for wound check.

## 2019-05-27 ENCOUNTER — Emergency Department
Admission: EM | Admit: 2019-05-27 | Discharge: 2019-05-27 | Disposition: A | Discharge status: Home / Self Care | Payer: Self-pay | Attending: Student in an Organized Health Care Education/Training Program | Admitting: Student in an Organized Health Care Education/Training Program

## 2019-05-27 ENCOUNTER — Emergency Department: Payer: Self-pay

## 2019-05-27 ENCOUNTER — Encounter: Payer: Self-pay | Admitting: *Deleted

## 2019-05-27 ENCOUNTER — Other Ambulatory Visit: Payer: Self-pay

## 2019-05-27 DIAGNOSIS — Z87891 Personal history of nicotine dependence: Secondary | ICD-10-CM | POA: Insufficient documentation

## 2019-05-27 DIAGNOSIS — J069 Acute upper respiratory infection, unspecified: Secondary | ICD-10-CM | POA: Insufficient documentation

## 2019-05-27 DIAGNOSIS — Z20828 Contact with and (suspected) exposure to other viral communicable diseases: Secondary | ICD-10-CM | POA: Insufficient documentation

## 2019-05-27 MED ORDER — PSEUDOEPH-BROMPHEN-DM 30-2-10 MG/5ML PO SYRP: 5.0000 mL | ORAL_SOLUTION | Freq: Four times a day (QID) | ORAL | 0 refills | Status: DC | PRN

## 2019-05-27 NOTE — ED Provider Notes (Signed)
[facility] Emergency Department Provider Note   ____________________________________________   First MD Initiated Contact with Patient 05/27/19 1539     (approximate)  I have reviewed the triage vital signs and the nursing notes.   HISTORY  Chief Complaint Cough   HPI [name] is a 28 y.o. male patient presents with cough, fever, and congestion for 2 days.  Patient states living girlfriend with same complaint but she was tested and found to be negative for COVID-19 over the weekend.  Patient denies nausea, vomiting, diarrhea.  Patient denies recent travel or known exposure to COVID-19.  Patient states mild dyspnea with exertion.         History reviewed. No pertinent past medical history.  There are no active problems to display for this patient.   History reviewed. No pertinent surgical history.  Prior to Admission medications   Medication Sig Start Date End Date Taking? Authorizing Provider  brompheniramine-pseudoephedrine-DM 30-2-10 MG/5ML syrup Take 5 mLs by mouth 4 (four) times daily as needed. 05/27/19   Sable Feil, PA-C    Allergies Patient has no known allergies.  History reviewed. No pertinent family history.  Social History Social History   Tobacco Use   Smoking status: Former Smoker   Smokeless tobacco: Never Used  Substance Use Topics   Alcohol use: No   Drug use: Not on file    Review of Systems Constitutional: No fever/chills.  Fatigue and body aches. Eyes: No visual changes. ENT: No sore throat.  Cardiovascular: Denies chest pain. Respiratory: Mild shortness of breath.  Productive cough. Gastrointestinal: No abdominal pain.  No nausea, no vomiting.  No diarrhea.  No constipation. Genitourinary: Negative for dysuria. Musculoskeletal: Negative for back pain. Skin: Negative for rash. Neurological: Negative for headaches, focal weakness or  numbness.   ____________________________________________   PHYSICAL EXAM:  VITAL SIGNS: ED Triage Vitals  Enc Vitals Group     BP 05/27/19 1535 121/75     Pulse Rate 05/27/19 1535 80     Resp 05/27/19 1535 16     Temp 05/27/19 1535 98.8 F (37.1 C)     Temp Source 05/27/19 1535 Oral     SpO2 05/27/19 1535 98 %     Weight 05/27/19 1532 217 lb (98.4 kg)     Height 05/27/19 1532 6' (1.829 m)     Head Circumference --      Peak Flow --      Pain Score 05/27/19 1532 3     Pain Loc --      Pain Edu? --      Excl. in [place]? --     Constitutional: Alert and oriented. Well appearing and in no acute distress. Hematological/Lymphatic/Immunilogical: No cervical lymphadenopathy. Cardiovascular: Normal rate, regular rhythm. Grossly normal heart sounds.  Good peripheral circulation. Respiratory: Normal respiratory effort.  No retractions. Lungs CTAB. Musculoskeletal: No lower extremity tenderness nor edema.  No joint effusions. Neurologic:  Normal speech and language. No gross focal neurologic deficits are appreciated. No gait instability. Skin:  Skin is warm, dry and intact. No rash noted. Psychiatric: Mood and affect are normal. Speech and behavior are normal.  ____________________________________________   LABS (all labs ordered are listed, but only abnormal results are displayed)  Labs Reviewed  SARS CORONAVIRUS 2 (TAT 6-24 HRS)   ____________________________________________  EKG   ____________________________________________  RADIOLOGY  ED MD interpretation:    Official radiology report(s): Dg Chest Portable 1 View  Result Date: 05/27/2019 CLINICAL DATA:  Cough, fever  and fatigue for 2 days. EXAM: PORTABLE CHEST 1 VIEW COMPARISON:  None. FINDINGS: 1606 hours. The heart size and mediastinal contours are normal. The lungs are clear. There is no pleural effusion or pneumothorax. No acute osseous findings are identified. IMPRESSION: No active cardiopulmonary process.  Electronically Signed   By: Carey Bullocks M.D.   On: 05/27/2019 16:18    ____________________________________________   PROCEDURES  Procedure(s) performed (including Critical Care):  Procedures   ____________________________________________   INITIAL IMPRESSION / ASSESSMENT AND PLAN / ED COURSE  As part of my medical decision making, I reviewed the following data within the electronic MEDICAL RECORD NUMBER         [name] was evaluated in Emergency Department on 05/27/2019 for the symptoms described in the history of present illness. He was evaluated in the context of the global COVID-19 pandemic, which necessitated consideration that the patient might be at risk for infection with the SARS-CoV-2 virus that causes COVID-19. Institutional protocols and algorithms that pertain to the evaluation of patients at risk for COVID-19 are in a state of rapid change based on information released by regulatory bodies including the CDC and federal and state organizations. These policies and algorithms were followed during the patient's care in the ED.  Patient sent off from job secondary to productive cough.  Physical exam is consistent with viral respiratory infection with cough.  Discussed negative chest x-ray findings with patient.  Patient advised self quarantine pending results of COVID-19 test.  Take medication as directed.      ____________________________________________   FINAL CLINICAL IMPRESSION(S) / ED DIAGNOSES  Final diagnoses:  Viral URI with cough     ED Discharge Orders         Ordered    brompheniramine-pseudoephedrine-DM 30-2-10 MG/5ML syrup  4 times daily PRN     05/27/19 1626           Note:  This document was prepared using Dragon voice recognition software and may include unintentional dictation errors.    Joni Reining, PA-C 05/27/19 1630    Willy Eddy, MD 05/27/19 [phone]

## 2019-05-27 NOTE — Discharge Instructions (Signed)
Advised self quarantine pending results of COVID-19 testing.

## 2019-05-27 NOTE — ED Triage Notes (Signed)
Pt to ED after being sent home for a cough. Pts girlfriend had a cough and congestion but tested negative for COID. Pt requesting evaluation of cough and testing for COVID. No fevers and SOB noted.

## 2019-05-27 NOTE — ED Notes (Signed)
See triage note  Presents with cough and some congestion  Denies any fever and is afebrile on arrival States his g/f tested negative for COVID

## 2019-05-28 LAB — SARS CORONAVIRUS 2 (TAT 6-24 HRS): SARS Coronavirus 2: NEGATIVE

## 2019-07-23 ENCOUNTER — Encounter: Payer: Self-pay | Admitting: Emergency Medicine

## 2019-07-23 ENCOUNTER — Emergency Department
Admission: EM | Admit: 2019-07-23 | Discharge: 2019-07-23 | Disposition: A | Discharge status: Home / Self Care | Payer: Self-pay | Attending: Emergency Medicine | Admitting: Emergency Medicine

## 2019-07-23 ENCOUNTER — Other Ambulatory Visit: Payer: Self-pay

## 2019-07-23 DIAGNOSIS — M549 Dorsalgia, unspecified: Secondary | ICD-10-CM | POA: Insufficient documentation

## 2019-07-23 DIAGNOSIS — Z5321 Procedure and treatment not carried out due to patient leaving prior to being seen by health care provider: Secondary | ICD-10-CM | POA: Insufficient documentation

## 2019-07-23 NOTE — ED Triage Notes (Signed)
AAOx3.  Skin warm and dry.  C/O low back pain after falling out of bed.  Gait steady.  Posture upright. MAE equally and strong. NAD

## 2019-08-09 ENCOUNTER — Other Ambulatory Visit: Payer: Self-pay

## 2019-08-09 ENCOUNTER — Encounter: Payer: Self-pay | Admitting: Emergency Medicine

## 2019-08-09 ENCOUNTER — Emergency Department
Admission: EM | Admit: 2019-08-09 | Discharge: 2019-08-09 | Disposition: A | Discharge status: Home / Self Care | Payer: Self-pay | Attending: Student in an Organized Health Care Education/Training Program | Admitting: Student in an Organized Health Care Education/Training Program

## 2019-08-09 DIAGNOSIS — Z87891 Personal history of nicotine dependence: Secondary | ICD-10-CM | POA: Insufficient documentation

## 2019-08-09 DIAGNOSIS — Z20828 Contact with and (suspected) exposure to other viral communicable diseases: Secondary | ICD-10-CM | POA: Insufficient documentation

## 2019-08-09 DIAGNOSIS — Z20822 Contact with and (suspected) exposure to covid-19: Secondary | ICD-10-CM

## 2019-08-09 LAB — SARS CORONAVIRUS 2 (TAT 6-24 HRS): SARS Coronavirus 2: NEGATIVE

## 2019-08-09 NOTE — ED Notes (Signed)
See triage note  Presents with sinus pressure and feels like nose is stopped up  Denies any fever

## 2019-08-09 NOTE — Discharge Instructions (Signed)
Follow-up with your regular doctor if not better in 3 days.  Return emergency department worsening.  Your Covid test would not result for 6 to 24 hours.  Nurse to call you with the results.  You should remain quarantined in your house alone until this test has been resulted.  You are not to go out in public.  You are to stay home.  If your test is negative you may return to your daily activities and return to work.  If positive you should remain quarantined alone in your home for another 10 days.

## 2019-08-09 NOTE — ED Provider Notes (Signed)
[facility] Emergency Department Provider Note  ____________________________________________   First MD Initiated Contact with Patient 08/09/19 1117     (approximate)  I have reviewed the triage vital signs and the nursing notes.   HISTORY  Chief Complaint sinus congestion    HPI [name] is a 28 y.o. male presents emergency department with sinus pressure and congestion along with a cough.  Patient did attend a funeral in which they were drinking a lot of alcohol and took his clothes off while outside.  He states that since then he has had some sinus congestion.  No fever or chills.  Retains his sense of taste and smell.  His employer is concerned that he has Covid.    History reviewed. No pertinent past medical history.  There are no problems to display for this patient.   History reviewed. No pertinent surgical history.  Prior to Admission medications   Medication Sig Start Date End Date Taking? Authorizing Provider  brompheniramine-pseudoephedrine-DM 30-2-10 MG/5ML syrup Take 5 mLs by mouth 4 (four) times daily as needed. 05/27/19   Joni Reining, PA-C    Allergies Patient has no known allergies.  History reviewed. No pertinent family history.  Social History Social History   Tobacco Use  . Smoking status: Former Games developer  . Smokeless tobacco: Never Used  Substance Use Topics  . Alcohol use: No  . Drug use: Not on file    Review of Systems  Constitutional: No fever/chills Eyes: No visual changes. ENT: No sore throat.  Positive for sinus congestion Respiratory: Positive cough Genitourinary: Negative for dysuria. Musculoskeletal: Negative for back pain. Skin: Negative for rash.    ____________________________________________   PHYSICAL EXAM:  VITAL SIGNS: ED Triage Vitals  Enc Vitals Group     BP 08/09/19 1038 (!) 153/84     Pulse Rate 08/09/19 1038 72     Resp 08/09/19 1038 15     Temp 08/09/19 1038 98.3 F (36.8  C)     Temp Source 08/09/19 1038 Oral     SpO2 08/09/19 1038 100 %     Weight 08/09/19 1038 222 lb (100.7 kg)     Height 08/09/19 1038 6' (1.829 m)     Head Circumference --      Peak Flow --      Pain Score 08/09/19 1050 0     Pain Loc --      Pain Edu? --      Excl. in [place]? --     Constitutional: Alert and oriented. Well appearing and in no acute distress. Eyes: Conjunctivae are normal.  Head: Atraumatic. Nose: No congestion/rhinnorhea. Mouth/Throat: Mucous membranes are moist.   Neck:  supple no lymphadenopathy noted Cardiovascular: Normal rate, regular rhythm. Heart sounds are normal Respiratory: Normal respiratory effort.  No retractions, lungs c t a  GU: deferred Musculoskeletal: FROM all extremities, warm and well perfused Neurologic:  Normal speech and language.  Skin:  Skin is warm, dry and intact. No rash noted. Psychiatric: Mood and affect are normal. Speech and behavior are normal.  ____________________________________________   LABS (all labs ordered are listed, but only abnormal results are displayed)  Labs Reviewed  SARS CORONAVIRUS 2 (TAT 6-24 HRS)   ____________________________________________   ____________________________________________  RADIOLOGY    ____________________________________________   PROCEDURES  Procedure(s) performed: No  Procedures    ____________________________________________   INITIAL IMPRESSION / ASSESSMENT AND PLAN / ED COURSE  Pertinent labs & imaging results that were available during my care  of the patient were reviewed by me and considered in my medical decision making (see chart for details).   Patient is 27 year old male presents to the emergency department as his employer is afraid he has Covid.  See HPI  Physical exam patient appears well.  Is afebrile.  Heart rate is normal.  There is no cough noted.  Lungs clear to all station.  Explained the findings to the patient.  Discussed how he is to be safe  with Covid symptoms.  He is to remain home alone and quarantined until his Covid test has resulted in 6 to 24 hours.  He was also given information on how to prevent obtaining Covid and preventing the spread of Covid.  He was discharged in stable condition with a note stating he should not return to work until his test has resulted.  If negative he may return the next day.  If positive he should remain quarantined for an additional 10 days.    [name] was evaluated in Emergency Department on 08/09/2019 for the symptoms described in the history of present illness. He was evaluated in the context of the global COVID-19 pandemic, which necessitated consideration that the patient might be at risk for infection with the SARS-CoV-2 virus that causes COVID-19. Institutional protocols and algorithms that pertain to the evaluation of patients at risk for COVID-19 are in a state of rapid change based on information released by regulatory bodies including the CDC and federal and state organizations. These policies and algorithms were followed during the patient's care in the ED.   As part of my medical decision making, I reviewed the following data within the [place] notes reviewed and incorporated, Old chart reviewed, Notes from prior ED visits and [place] Controlled Substance Database  ____________________________________________   FINAL CLINICAL IMPRESSION(S) / ED DIAGNOSES  Final diagnoses:  Suspected COVID-19 virus infection      NEW MEDICATIONS STARTED DURING THIS VISIT:  New Prescriptions   No medications on file     Note:  This document was prepared using Dragon voice recognition software and may include unintentional dictation errors.    Versie Starks, PA-C 08/09/19 1209    Merlyn Lot, MD 08/09/19 [phone]

## 2019-08-09 NOTE — ED Triage Notes (Signed)
Pt to ED via POV c/o sinus congestion and sinus pressure. Pt is in NAD.

## 2019-11-07 ENCOUNTER — Other Ambulatory Visit: Payer: Self-pay

## 2019-11-07 ENCOUNTER — Emergency Department
Admission: EM | Admit: 2019-11-07 | Discharge: 2019-11-07 | Disposition: A | Discharge status: Home / Self Care | Payer: Self-pay | Attending: Emergency Medicine | Admitting: Emergency Medicine

## 2019-11-07 ENCOUNTER — Encounter: Payer: Self-pay | Admitting: Emergency Medicine

## 2019-11-07 DIAGNOSIS — K047 Periapical abscess without sinus: Secondary | ICD-10-CM | POA: Insufficient documentation

## 2019-11-07 DIAGNOSIS — Z79899 Other long term (current) drug therapy: Secondary | ICD-10-CM | POA: Insufficient documentation

## 2019-11-07 DIAGNOSIS — F17210 Nicotine dependence, cigarettes, uncomplicated: Secondary | ICD-10-CM | POA: Insufficient documentation

## 2019-11-07 MED ORDER — AMOXICILLIN-POT CLAVULANATE 875-125 MG PO TABS: 1.0000 | ORAL_TABLET | Freq: Two times a day (BID) | ORAL | 0 refills | Status: DC

## 2019-11-07 MED ORDER — TRAMADOL HCL 50 MG PO TABS: 50.0000 mg | ORAL_TABLET | Freq: Four times a day (QID) | ORAL | 0 refills | Status: DC | PRN

## 2019-11-07 MED ORDER — AMOXICILLIN-POT CLAVULANATE 875-125 MG PO TABS
1.0000 | ORAL_TABLET | Freq: Once | ORAL | Status: AC
  Administered 2019-11-07: 1 via ORAL
  Filled 2019-11-07: qty 1

## 2019-11-07 NOTE — ED Notes (Signed)
See triage note  Presents with left lower gum line swelling and pain  Possible dental abscess

## 2019-11-07 NOTE — ED Triage Notes (Signed)
Pt via pov from home with dental pain and swelling; states it began last night; has not had pain prior to this. Pt states he could not sleep due to the pain. Pt alert & oriented with NAD noted.

## 2019-11-07 NOTE — ED Provider Notes (Signed)
[facility] Emergency Department Provider Note  ____________________________________________  Time seen: Approximately 4:32 PM  I have reviewed the triage vital signs and the nursing notes.   HISTORY  Chief Complaint Dental Pain    HPI [name] is a 29 y.o. male who presents the emergency department complaining of pain and swelling along the left gumline. Patient states that he believes that he has a dental abscess. Symptoms began yesterday. No difficulty breathing or swallowing. No fevers or chills. Patient denies any other complaints at this time.  No medical history. No medications prior to arrival.        History reviewed. No pertinent past medical history.  There are no problems to display for this patient.   History reviewed. No pertinent surgical history.  Prior to Admission medications   Medication Sig Start Date End Date Taking? Authorizing Provider  amoxicillin-clavulanate (AUGMENTIN) 875-125 MG tablet Take 1 tablet by mouth 2 (two) times daily. 11/07/19   Arienne Gartin, Delorise Royals, PA-C  brompheniramine-pseudoephedrine-DM 30-2-10 MG/5ML syrup Take 5 mLs by mouth 4 (four) times daily as needed. 05/27/19   Joni Reining, PA-C  traMADol (ULTRAM) 50 MG tablet Take 1 tablet (50 mg total) by mouth every 6 (six) hours as needed. 11/07/19   Aradhana Gin, Delorise Royals, PA-C    Allergies Patient has no known allergies.  History reviewed. No pertinent family history.  Social History Social History   Tobacco Use  . Smoking status: Current Every Day Smoker    Types: Cigars  . Smokeless tobacco: Never Used  . Tobacco comment: 1 cigar per day  Substance Use Topics  . Alcohol use: No  . Drug use: Yes    Types: Marijuana    Comment: occasionally     Review of Systems  Constitutional: No fever/chills Eyes: No visual changes. No discharge ENT: Positive for left lower dental pain Cardiovascular: no chest pain. Respiratory: no cough. No  SOB. Gastrointestinal: No abdominal pain.  No nausea, no vomiting.  No diarrhea.  No constipation. Musculoskeletal: Negative for musculoskeletal pain. Skin: Negative for rash, abrasions, lacerations, ecchymosis. Neurological: Negative for headaches, focal weakness or numbness. 10-point ROS otherwise negative.  ____________________________________________   PHYSICAL EXAM:  VITAL SIGNS: ED Triage Vitals  Enc Vitals Group     BP 11/07/19 1520 (!) 134/91     Pulse Rate 11/07/19 1520 85     Resp 11/07/19 1520 (!) 8     Temp 11/07/19 1520 98.9 F (37.2 C)     Temp Source 11/07/19 1520 Oral     SpO2 11/07/19 1520 98 %     Weight 11/07/19 1521 217 lb (98.4 kg)     Height 11/07/19 1521 6' (1.829 m)     Head Circumference --      Peak Flow --      Pain Score 11/07/19 1520 8     Pain Loc --      Pain Edu? --      Excl. in GC? --      Constitutional: Alert and oriented. Well appearing and in no acute distress. Eyes: Conjunctivae are normal. PERRL. EOMI. Head: Atraumatic. ENT:      Ears:       Nose: No congestion/rhinnorhea.      Mouth/Throat: Mucous membranes are moist. Mild erythema and edema along the left lower dentition. No appreciable abscess. Uvula is midline. Oropharynx is nonerythematous and nonedematous. No tenderness to palpation of the submandibular, submental, anterior neck region. Neck: No stridor. Neck is supple full  range of motion. No erythema or edema along the neck. Hematological/Lymphatic/Immunilogical: No cervical lymphadenopathy. Cardiovascular: Normal rate, regular rhythm. Normal S1 and S2.  Good peripheral circulation. Respiratory: Normal respiratory effort without tachypnea or retractions. Lungs CTAB. Good air entry to the bases with no decreased or absent breath sounds. Musculoskeletal: Full range of motion to all extremities. No gross deformities appreciated. Neurologic:  Normal speech and language. No gross focal neurologic deficits are appreciated.  Skin:   Skin is warm, dry and intact. No rash noted. Psychiatric: Mood and affect are normal. Speech and behavior are normal. Patient exhibits appropriate insight and judgement.   ____________________________________________   LABS (all labs ordered are listed, but only abnormal results are displayed)  Labs Reviewed - No data to display ____________________________________________  EKG   ____________________________________________  RADIOLOGY   No results found.  ____________________________________________    PROCEDURES  Procedure(s) performed:    Procedures    Medications  amoxicillin-clavulanate (AUGMENTIN) 875-125 MG per tablet 1 tablet (has no administration in time range)     ____________________________________________   INITIAL IMPRESSION / ASSESSMENT AND PLAN / ED COURSE  Pertinent labs & imaging results that were available during my care of the patient were reviewed by me and considered in my medical decision making (see chart for details).  Review of the [place] CSRS was performed in accordance of the [place] prior to dispensing any controlled drugs.           Patient's diagnosis is consistent with dental infection. Patient presented to the emergency department complaining of left lower dental pain. Findings are consistent with dental infection. No appreciable drainable abscess. No indication for labs or imaging at this time.. Patient will be discharged home with prescriptions for Augmentin, Ultram. Patient is to follow up with dentist as needed or otherwise directed. Patient is given ED precautions to return to the ED for any worsening or new symptoms.     ____________________________________________  FINAL CLINICAL IMPRESSION(S) / ED DIAGNOSES  Final diagnoses:  Dental infection      NEW MEDICATIONS STARTED DURING THIS VISIT:  ED Discharge Orders         Ordered    amoxicillin-clavulanate (AUGMENTIN) 875-125 MG tablet  2 times daily     11/07/19  1655    traMADol (ULTRAM) 50 MG tablet  Every 6 hours PRN     11/07/19 1655              This chart was dictated using voice recognition software/Dragon. Despite best efforts to proofread, errors can occur which can change the meaning. Any change was purely unintentional.    Darletta Moll, PA-C 11/07/19 1658    Carrie Mew, MD 11/08/19 0005

## 2019-12-13 ENCOUNTER — Ambulatory Visit: Payer: Self-pay

## 2020-02-24 ENCOUNTER — Other Ambulatory Visit: Payer: Self-pay

## 2020-02-24 ENCOUNTER — Emergency Department
Admission: EM | Admit: 2020-02-24 | Discharge: 2020-02-24 | Disposition: A | Discharge status: Home / Self Care | Payer: Self-pay | Attending: Emergency Medicine | Admitting: Emergency Medicine

## 2020-02-24 DIAGNOSIS — K029 Dental caries, unspecified: Secondary | ICD-10-CM

## 2020-02-24 DIAGNOSIS — K047 Periapical abscess without sinus: Secondary | ICD-10-CM | POA: Insufficient documentation

## 2020-02-24 DIAGNOSIS — Y939 Activity, unspecified: Secondary | ICD-10-CM | POA: Insufficient documentation

## 2020-02-24 DIAGNOSIS — F17290 Nicotine dependence, other tobacco product, uncomplicated: Secondary | ICD-10-CM | POA: Insufficient documentation

## 2020-02-24 DIAGNOSIS — X58XXXA Exposure to other specified factors, initial encounter: Secondary | ICD-10-CM | POA: Insufficient documentation

## 2020-02-24 DIAGNOSIS — S025XXA Fracture of tooth (traumatic), initial encounter for closed fracture: Secondary | ICD-10-CM | POA: Insufficient documentation

## 2020-02-24 DIAGNOSIS — Y999 Unspecified external cause status: Secondary | ICD-10-CM | POA: Insufficient documentation

## 2020-02-24 DIAGNOSIS — Y929 Unspecified place or not applicable: Secondary | ICD-10-CM | POA: Insufficient documentation

## 2020-02-24 MED ORDER — TRAMADOL HCL 50 MG PO TABS: 50.0000 mg | ORAL_TABLET | Freq: Two times a day (BID) | ORAL | 0 refills | Status: AC

## 2020-02-24 MED ORDER — AMOXICILLIN 500 MG PO CAPS
500.0000 mg | ORAL_CAPSULE | Freq: Once | ORAL | Status: AC
  Administered 2020-02-24: 500 mg via ORAL
  Filled 2020-02-24: qty 1

## 2020-02-24 MED ORDER — TRAMADOL HCL 50 MG PO TABS
50.0000 mg | ORAL_TABLET | Freq: Once | ORAL | Status: AC
  Administered 2020-02-24: 50 mg via ORAL
  Filled 2020-02-24: qty 1

## 2020-02-24 MED ORDER — AMOXICILLIN 500 MG PO CAPS: 500.0000 mg | ORAL_CAPSULE | Freq: Three times a day (TID) | ORAL | 0 refills | Status: DC

## 2020-02-24 MED ORDER — LIDOCAINE-EPINEPHRINE 2 %-1:100000 IJ SOLN
1.7000 mL | Freq: Once | INTRAMUSCULAR | Status: AC
  Administered 2020-02-24: 1.7 mL
  Filled 2020-02-24: qty 1.7

## 2020-02-24 NOTE — ED Triage Notes (Signed)
Pt arrives to ED via POV from home with c/o upper and lower right sided dental pain x2 days. OTC Ibuprofen taken PTA without relief. Pt denies fever; no N/V/D.

## 2020-02-24 NOTE — ED Provider Notes (Signed)
[facility] Emergency Department Provider Note ____________________________________________  Time seen: 2100  I have reviewed the triage vital signs and the nursing notes.  HISTORY  Chief Complaint  Dental Pain  HPI [name] is a 29 y.o. male presents to the ED for evaluation of dental pain. He notes pain to the upper 1st molar and lower 1st molar. He admits to poor dentition, and has not had dental follow-up in years. He reports some facial swelling, but denies fevers, chills, or sweats. He has no swelling of the throat, difficulty breathing, or swallowing.    History reviewed. No pertinent past medical history.  There are no problems to display for this patient.  History reviewed. No pertinent surgical history.  Prior to Admission medications   Medication Sig Start Date End Date Taking? Authorizing Provider  amoxicillin (AMOXIL) 500 MG capsule Take 1 capsule (500 mg total) by mouth 3 (three) times daily. 02/24/20   Teresita Fanton, Charlesetta Ivory, PA-C  traMADol (ULTRAM) 50 MG tablet Take 1 tablet (50 mg total) by mouth 2 (two) times daily for 5 days. 02/24/20 02/29/20  Dorothy Landgrebe, Charlesetta Ivory, PA-C    Allergies Patient has no known allergies.  History reviewed. No pertinent family history.  Social History Social History   Tobacco Use  . Smoking status: Current Every Day Smoker    Types: Cigars  . Smokeless tobacco: Never Used  . Tobacco comment: 1 cigar per day  Substance Use Topics  . Alcohol use: No  . Drug use: Yes    Types: Marijuana    Comment: occasionally    Review of Systems  Constitutional: Negative for fever. Eyes: Negative for visual changes. ENT: Negative for sore throat. Dental pain as above. Respiratory: Negative for shortness of breath. Gastrointestinal: Negative for abdominal pain, vomiting and diarrhea. Musculoskeletal: Negative for back pain. Skin: Negative for rash. Neurological: Negative for headaches, focal weakness or  numbness. ____________________________________________  PHYSICAL EXAM:  VITAL SIGNS: ED Triage Vitals  Enc Vitals Group     BP 02/24/20 2004 (!) 145/95     Pulse Rate 02/24/20 2004 77     Resp 02/24/20 2004 16     Temp 02/24/20 2004 99.8 F (37.7 C)     Temp Source 02/24/20 2004 Oral     SpO2 02/24/20 2004 98 %     Weight 02/24/20 2005 240 lb (108.9 kg)     Height 02/24/20 2005 6' (1.829 m)     Head Circumference --      Peak Flow --      Pain Score 02/24/20 2005 10     Pain Loc --      Pain Edu? --      Excl. in GC? --     Constitutional: Alert and oriented. Well appearing and in no distress. Head: Normocephalic and atraumatic. Eyes: Conjunctivae are normal. Normal extraocular movements Ears: Canals clear. TMs intact bilaterally. Mouth/Throat: Mucous membranes are moist.  Uvula is midline and tonsils are flat.  No oropharyngeal lesions noted.  Dental caries with first molars to the right side upper and lower, both decayed to the gumline.  No focal gum swelling is appreciated.  No brawny sublingual erythema is noted. Neck: Supple. No thyromegaly. Hematological/Lymphatic/Immunological: No cervical lymphadenopathy. Cardiovascular: Normal rate, regular rhythm. Normal distal pulses. Respiratory: Normal respiratory effort. No wheezes/rales/rhonchi. Musculoskeletal: Nontender with normal range of motion in all extremities.  Neurologic:  Normal gait without ataxia. Normal speech and language. No gross focal neurologic deficits are appreciated. Skin:  Skin is warm, dry and intact. No rash noted. ____________________________________________  PROCEDURES  Ultram 50 mg PO Amoxicillin 500 mg PO  Dental Block  Date/Time: 02/24/2020 9:16 PM Performed by: Lissa Hoard, PA-C Authorized by: Lissa Hoard, PA-C   Consent:    Consent obtained:  Verbal   Consent given by:  Patient   Risks discussed:  Pain Indications:    Indications: dental pain   Location:     Block type:  Supraperiosteal   Supraperiosteal location:  Upper teeth and lower teeth   Upper teeth location:  3/RU 1st molar   Lower teeth location:  30/RL 1st molar Procedure details (see MAR for exact dosages):    Injection procedure:  Anatomic landmarks identified, introduced needle, incremental injection and anatomic landmarks palpated Post-procedure details:    Outcome:  Anesthesia achieved   Patient tolerance of procedure:  Tolerated well, no immediate complications   ____________________________________________  INITIAL IMPRESSION / ASSESSMENT AND PLAN / ED COURSE  DDX: dental abscess, dental fracture, Ludwig's angina, dental trauma, jaw fracture.  Patient presents to the ED for evaluation of right upper and lower dental pain in the face of dental caries.  Patient without any signs of acute oral infection, sepsis, or respiratory distress.  He is given a dental block for his right upper/lower first molars.  He started on amoxicillin empirically.  Prescriptions for the same are provided as well as Ultram for pain relief.  Dental clinic referral is also given.  Return precautions have been discussed.  [name] was evaluated in Emergency Department on 02/24/2020 for the symptoms described in the history of present illness. He was evaluated in the context of the global COVID-19 pandemic, which necessitated consideration that the patient might be at risk for infection with the SARS-CoV-2 virus that causes COVID-19. Institutional protocols and algorithms that pertain to the evaluation of patients at risk for COVID-19 are in a state of rapid change based on information released by regulatory bodies including the CDC and federal and state organizations. These policies and algorithms were followed during the patient's care in the ED.  I reviewed the patient's prescription history over the last 12 months in the multi-state controlled substances database(s) that includes [place], [place],  [place], [place], [place], [place], [place], [place], [place], [place], [place], [place], [place], and [place].  Results were notable for no RX history. ____________________________________________  FINAL CLINICAL IMPRESSION(S) / ED DIAGNOSES  Final diagnoses:  Pain due to dental caries  Closed fracture of tooth, initial encounter  Dental abscess      Lissa Hoard, PA-C 02/24/20 2120    Shaune Pollack, MD 02/25/20 1609

## 2020-02-24 NOTE — Discharge Instructions (Signed)
Take the antibiotic until all pills are gone. Follow-up with one of the dental providers listed below.   OPTIONS FOR DENTAL FOLLOW UP CARE  [place] Department of Health and Human Services - Local Safety Net Dental Clinics [email or web address]   [facility] [phone])  [name] [phone])  [place] [phone])  [facility] [phone])  [facility] [phone]) This clinic caters to the indigent population and is on a lottery system. Location: [employer] of Dentistry, [employer], [address], [place] Clinic Hours: Wednesdays from 6pm - 9pm, patients seen by a lottery system. For dates, call or go to [email or web address] Services: Cleanings, fillings and simple extractions. Payment Options: DENTAL WORK IS FREE OF CHARGE. Bring proof of income or support. Best way to get seen: Arrive at 5:15 pm - this is a lottery, NOT first come/first serve, so arriving earlier will not increase your chances of being seen.     [facility] [phone] Select option 1 for emergencies   Location: [facility]istry, [place], [address], [place] Clinic Hours: No walk-ins accepted - call the day before to schedule an appointment. Check in times are 9:30 am and 1:30 pm. Services: Simple extractions, temporary fillings, pulpectomy/pulp debridement, uncomplicated abscess drainage. Payment Options: PAYMENT IS DUE AT THE TIME OF SERVICE.  Fee is usually $100-200, additional surgical procedures (e.g. abscess drainage) may be extra. Cash, checks, Visa/MasterCard accepted.  Can file Medicaid if patient is covered for dental - patient should call case worker to check. No discount for [facility] patients. Best way to get seen: MUST call the day before and get onto the schedule. Can usually be  seen the next 1-2 days. No walk-ins accepted.     [facility] [phone]   Location: [facility], [address], [place] Clinic Hours: M, W, Th, F 8am or 1:30pm, Tues 9a or 1:30 - first come/first served. Services: Simple extractions, temporary fillings, uncomplicated abscess drainage.  You do not need to be an [facility] resident. Payment Options: PAYMENT IS DUE AT THE TIME OF SERVICE. Dental insurance, otherwise sliding scale - bring proof of income or support. Depending on income and treatment needed, cost is usually $50-200. Best way to get seen: Arrive early as it is first come/first served.     [facility] [phone]   Location: [address] Clinic Hours: Mon-Thu 8a-5p Services: Most basic dental services including extractions and fillings. Payment Options: PAYMENT IS DUE AT THE TIME OF SERVICE. Sliding scale, up to 50% off - bring proof if income or support. Medicaid with dental option accepted. Best way to get seen: Call to schedule an appointment, can usually be seen within 2 weeks OR they will try to see walk-ins - show up at 8a or 2p (you may have to wait).     [facility] [phone] [place] RESIDENTS ONLY   Location: [facility], [address] [place], [place] [address] Clinic Hours: By appointment only. Monday - Thursday 8am-5pm, Friday 8am-12pm Services: Cleanings, fillings, extractions. Payment Options: PAYMENT IS DUE AT THE TIME OF SERVICE. Cash, Visa or MasterCard. Sliding scale - $30 minimum per service. Best way to get seen: Come in to office, complete packet and make an appointment - need proof of income or support monies for each household member and proof of [facility] residence. Usually takes about a month  to get in.     [facility] [phone]   Location: [address], [facility] Clinic Hours: Walk-in Urgent Care Dental Services are offered Monday-Friday mornings only. The numbers of emergencies accepted daily is limited to the number of providers available. Maximum 15 - Mondays, Wednesdays & Thursdays Maximum 10 - Tuesdays & Fridays Services: You do not need to be a [facility] resident to be seen for a dental emergency. Emergencies are defined as pain, swelling, abnormal bleeding, or dental trauma. Walkins will receive x-rays if needed. NOTE: Dental cleaning is not an emergency. Payment Options: PAYMENT IS DUE AT THE TIME OF SERVICE. Minimum co-pay is $40.00 for uninsured patients. Minimum co-pay is $3.00 for Medicaid with dental coverage. Dental Insurance is accepted and must be presented at time of visit. Medicare does not cover dental. Forms of payment: Cash, credit card, checks. Best way to get seen: If not previously registered with the clinic, walk-in dental registration begins at 7:15 am and is on a first come/first serve basis. If previously registered with the clinic, call to make an appointment.     [facility] [phone] [place] RESIDENTS ONLY   Location: [address] [place], [place] Clinic Hours: Mon-Thu 10a-2p Services: Extractions only! Payment Options: FREE (donations accepted) - bring proof of income or support Best way to get seen: Call and schedule an appointment OR come at 8am on the 1st Monday of every month (except for holidays) when it is first come/first served.     [facility] [phone]   Location: [address] [place], [place] Clinic Hours: Friday mornings Services, Payment Options, Best way to get seen: Call for info

## 2020-09-27 ENCOUNTER — Other Ambulatory Visit: Payer: Self-pay

## 2020-09-27 ENCOUNTER — Encounter: Payer: Self-pay | Admitting: Emergency Medicine

## 2020-09-27 ENCOUNTER — Emergency Department
Admission: EM | Admit: 2020-09-27 | Discharge: 2020-09-27 | Disposition: A | Discharge status: Home / Self Care | Payer: Self-pay | Attending: Emergency Medicine | Admitting: Emergency Medicine

## 2020-09-27 DIAGNOSIS — L03317 Cellulitis of buttock: Secondary | ICD-10-CM | POA: Insufficient documentation

## 2020-09-27 DIAGNOSIS — F17290 Nicotine dependence, other tobacco product, uncomplicated: Secondary | ICD-10-CM | POA: Insufficient documentation

## 2020-09-27 MED ORDER — HYDROCODONE-ACETAMINOPHEN 5-325 MG PO TABS
1.0000 | ORAL_TABLET | Freq: Once | ORAL | Status: AC
  Administered 2020-09-27: 1 via ORAL
  Filled 2020-09-27: qty 1

## 2020-09-27 MED ORDER — HYDROCODONE-ACETAMINOPHEN 5-325 MG PO TABS: 1.0000 | ORAL_TABLET | ORAL | 0 refills | Status: DC | PRN

## 2020-09-27 MED ORDER — SULFAMETHOXAZOLE-TRIMETHOPRIM 800-160 MG PO TABS: 1.0000 | ORAL_TABLET | Freq: Two times a day (BID) | ORAL | 0 refills | Status: DC

## 2020-09-27 NOTE — ED Notes (Signed)
Pt to ED with boil on inner R buttock that is hard and painful to touch. States started 5d ago and got worse 2d ago. This is second time he has had this, last time was a few years ago. Pt resting in bed on phone.

## 2020-09-27 NOTE — ED Provider Notes (Signed)
[facility] Emergency Department Provider Note  ____________________________________________  Time seen: Approximately 4:54 PM  I have reviewed the triage vital signs and the nursing notes.   HISTORY  Chief Complaint Abscess    HPI [name] is a 30 y.o. male who presents the emergency department complaining of an abscess to the right buttock.  This is along the medial aspect of the buttock.  Patient states that he has had this in the past.  He does have a history of recurring skin lesions but typically these improved with compresses.  This has become exceptionally painful for him to walk, sit.  Patient has had no drainage from the site.  No systemic complaints.  This is halfway between the superior aspect of the intergluteal cleft and the rectum.  He denies any GI complaints.         History reviewed. No pertinent past medical history.  There are no problems to display for this patient.   History reviewed. No pertinent surgical history.  Prior to Admission medications   Medication Sig Start Date End Date Taking? Authorizing Provider  HYDROcodone-acetaminophen (NORCO/VICODIN) 5-325 MG tablet Take 1 tablet by mouth every 4 (four) hours as needed for moderate pain. 09/27/20  Yes Emmabelle Fear, Delorise Royals, PA-C  sulfamethoxazole-trimethoprim (BACTRIM DS) 800-160 MG tablet Take 1 tablet by mouth 2 (two) times daily. 09/27/20  Yes Gleason Ardoin, Delorise Royals, PA-C  amoxicillin (AMOXIL) 500 MG capsule Take 1 capsule (500 mg total) by mouth 3 (three) times daily. 02/24/20   Menshew, Charlesetta Ivory, PA-C    Allergies Patient has no known allergies.  No family history on file.  Social History Social History   Tobacco Use  . Smoking status: Current Every Day Smoker    Types: Cigars  . Smokeless tobacco: Never Used  . Tobacco comment: 1 cigar per day  Substance Use Topics  . Alcohol use: No  . Drug use: Yes    Types: Marijuana    Comment: occasionally      Review of Systems  Constitutional: No fever/chills Eyes: No visual changes. No discharge ENT: No upper respiratory complaints. Cardiovascular: no chest pain. Respiratory: no cough. No SOB. Gastrointestinal: No abdominal pain.  No nausea, no vomiting.   Musculoskeletal: Negative for musculoskeletal pain. Skin: Cellulitis versus abscess to the right medial buttock Neurological: Negative for headaches, focal weakness or numbness.  10 System ROS otherwise negative.  ____________________________________________   PHYSICAL EXAM:  VITAL SIGNS: ED Triage Vitals  Enc Vitals Group     BP 09/27/20 1458 136/82     Pulse Rate 09/27/20 1458 (!) 102     Resp 09/27/20 1458 16     Temp 09/27/20 1458 98.8 F (37.1 C)     Temp Source 09/27/20 1458 Oral     SpO2 09/27/20 1458 97 %     Weight 09/27/20 1456 234 lb (106.1 kg)     Height 09/27/20 1456 6' (1.829 m)     Head Circumference --      Peak Flow --      Pain Score 09/27/20 1456 10     Pain Loc --      Pain Edu? --      Excl. in GC? --      Constitutional: Alert and oriented. Well appearing and in no acute distress. Eyes: Conjunctivae are normal. PERRL. EOMI. Head: Atraumatic. ENT:      Ears:       Nose: No congestion/rhinnorhea.      Mouth/Throat: Mucous  membranes are moist.  Neck: No stridor.    Cardiovascular: Normal rate, regular rhythm. Normal S1 and S2.  Good peripheral circulation. Respiratory: Normal respiratory effort without tachypnea or retractions. Lungs CTAB. Good air entry to the bases with no decreased or absent breath sounds. Gastrointestinal: Bowel sounds 4 quadrants. Soft and nontender to palpation. No guarding or rigidity. No palpable masses. No distention. No CVA tenderness. Musculoskeletal: Full range of motion to all extremities. No gross deformities appreciated. Neurologic:  Normal speech and language. No gross focal neurologic deficits are appreciated.  Skin:  Skin is warm, dry and intact. No rash  noted.  Visualization of the right buttock revealed some erythema, edema to the medial aspect midway down the intergluteal cleft.  There is no extension from the superior aspect or perirectal region.  Area is exceptionally tender to palpation.  Very firm to palpation with no obvious fluctuance. Psychiatric: Mood and affect are normal. Speech and behavior are normal. Patient exhibits appropriate insight and judgement.   ____________________________________________   LABS (all labs ordered are listed, but only abnormal results are displayed)  Labs Reviewed - No data to display ____________________________________________  EKG   ____________________________________________  RADIOLOGY   No results found.  ____________________________________________    PROCEDURES  Procedure(s) performed:    Ultrasound ED Soft Tissue  Date/Time: 09/27/2020 5:18 PM Performed by: Racheal Patches, PA-C Authorized by: Racheal Patches, PA-C   Procedure details:    Indications: localization of abscess and evaluate for cellulitis     Transverse view:  Visualized   Longitudinal view:  Visualized   Images: not archived   Location:    Location: buttocks     Side:  Right Findings:     no abscess present    cellulitis present    no foreign body present      Medications  HYDROcodone-acetaminophen (NORCO/VICODIN) 5-325 MG per tablet 1 tablet (1 tablet Oral Given 09/27/20 1651)     ____________________________________________   INITIAL IMPRESSION / ASSESSMENT AND PLAN / ED COURSE  Pertinent labs & imaging results that were available during my care of the patient were reviewed by me and considered in my medical decision making (see chart for details).  Review of the [place] CSRS was performed in accordance of the [facility] prior to dispensing any controlled drugs.           Patient's diagnosis is consistent with cellulitis to the right buttock.  Patient presented to emergency  department complaining of pain to the buttocks with possible abscess versus cellulitis.  Findings were consistent with cellulitis but given the area, bedside ultrasound was used to evaluate for loculated fluid collection.  When imaging there was no acute well-defined abscess identified.  Significant stranding concerning for cellulitis which matches clinical presentation.  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  At this time I will treat the patient with Bactrim and limited pain medication for symptom relief.  After completing antibiotic course patient should follow-up with dermatology in regards to repetitive skin infections.  Return precautions discussed at length with the patient.  Patient will return for any worsening pain, erythema, purulent drainage or findings concerning for developing abscess.  Otherwise follow-up with primary care or dermatology as described. Patient is given ED precautions to return to the ED for any worsening or new symptoms.     ____________________________________________  FINAL CLINICAL IMPRESSION(S) / ED DIAGNOSES  Final diagnoses:  Cellulitis of buttock  NEW MEDICATIONS STARTED DURING THIS VISIT:  ED Discharge Orders         Ordered    sulfamethoxazole-trimethoprim (BACTRIM DS) 800-160 MG tablet  2 times daily        09/27/20 1728    HYDROcodone-acetaminophen (NORCO/VICODIN) 5-325 MG tablet  Every 4 hours PRN        09/27/20 1728              This chart was dictated using voice recognition software/Dragon. Despite best efforts to proofread, errors can occur which can change the meaning. Any change was purely unintentional.    Racheal Patches, PA-C 09/27/20 1734    Merwyn Katos, MD 09/28/20 [phone]

## 2020-09-27 NOTE — ED Triage Notes (Signed)
Pt to ED via POV stating that he has a rectal abscess that has been there since Tuesday. Pt states that it is very painful to sit. Pt having difficulty walking due to the area of the abscess. Pt denies fever. Pt is in NAD.

## 2020-11-24 ENCOUNTER — Emergency Department: Payer: Self-pay

## 2020-11-24 ENCOUNTER — Other Ambulatory Visit: Payer: Self-pay

## 2020-11-24 ENCOUNTER — Emergency Department
Admission: EM | Admit: 2020-11-24 | Discharge: 2020-11-24 | Disposition: A | Discharge status: Home / Self Care | Payer: Self-pay | Attending: Emergency Medicine | Admitting: Emergency Medicine

## 2020-11-24 DIAGNOSIS — X500XXA Overexertion from strenuous movement or load, initial encounter: Secondary | ICD-10-CM | POA: Insufficient documentation

## 2020-11-24 DIAGNOSIS — F17290 Nicotine dependence, other tobacco product, uncomplicated: Secondary | ICD-10-CM | POA: Insufficient documentation

## 2020-11-24 DIAGNOSIS — S29011A Strain of muscle and tendon of front wall of thorax, initial encounter: Secondary | ICD-10-CM | POA: Insufficient documentation

## 2020-11-24 MED ORDER — TRAMADOL HCL 50 MG PO TABS: 50.0000 mg | ORAL_TABLET | Freq: Four times a day (QID) | ORAL | 0 refills | Status: AC | PRN

## 2020-11-24 MED ORDER — KETOROLAC TROMETHAMINE 60 MG/2ML IM SOLN
60.0000 mg | Freq: Once | INTRAMUSCULAR | Status: AC
  Administered 2020-11-24: 60 mg via INTRAMUSCULAR
  Filled 2020-11-24: qty 2

## 2020-11-24 MED ORDER — ORPHENADRINE CITRATE ER 100 MG PO TB12: 100.0000 mg | ORAL_TABLET | Freq: Two times a day (BID) | ORAL | 0 refills | Status: AC

## 2020-11-24 MED ORDER — LIDOCAINE 5 % EX PTCH
1.0000 | MEDICATED_PATCH | CUTANEOUS | Status: DC
  Administered 2020-11-24: 1 via TRANSDERMAL
  Filled 2020-11-24: qty 1

## 2020-11-24 MED ORDER — ORPHENADRINE CITRATE 30 MG/ML IJ SOLN
60.0000 mg | Freq: Two times a day (BID) | INTRAMUSCULAR | Status: DC
  Administered 2020-11-24: 60 mg via INTRAMUSCULAR
  Filled 2020-11-24: qty 2

## 2020-11-24 MED ORDER — NAPROXEN 500 MG PO TABS: 500.0000 mg | ORAL_TABLET | Freq: Two times a day (BID) | ORAL | Status: AC

## 2020-11-24 NOTE — ED Provider Notes (Signed)
[facility] Emergency Department Provider Note   ____________________________________________   Event Date/Time   First MD Initiated Contact with Patient 11/24/20 [phone]     (approximate)  I have reviewed the triage vital signs and the nursing notes.   HISTORY  Chief Complaint Painful Chest    HPI [name] is a 30 y.o. male patient presents with left chest wall pain that increases with movement of left upper extremity and deep inspiration.  Patient states discomfort started last night.  Admits to repetitive heavy lifting at work and believe he might have pulled "something".  Patient denies shortness of breath, weakness, vision disturbance, or vertigo.  Denies pain at this time.         History reviewed. No pertinent past medical history.  There are no problems to display for this patient.   History reviewed. No pertinent surgical history.  Prior to Admission medications   Medication Sig Start Date End Date Taking? Authorizing Provider  naproxen (NAPROSYN) 500 MG tablet Take 1 tablet (500 mg total) by mouth 2 (two) times daily with a meal. 11/24/20  Yes Joni Reining, PA-C  orphenadrine (NORFLEX) 100 MG tablet Take 1 tablet (100 mg total) by mouth 2 (two) times daily. 11/24/20  Yes Joni Reining, PA-C  traMADol (ULTRAM) 50 MG tablet Take 1 tablet (50 mg total) by mouth every 6 (six) hours as needed for up to 3 days. 11/24/20 11/27/20 Yes Joni Reining, PA-C    Allergies Patient has no known allergies.  No family history on file.  Social History Social History   Tobacco Use  . Smoking status: Current Every Day Smoker    Types: Cigars  . Smokeless tobacco: Never Used  . Tobacco comment: 1 cigar per day  Substance Use Topics  . Alcohol use: No  . Drug use: Yes    Types: Marijuana    Comment: occasionally    Review of Systems Constitutional: No fever/chills Eyes: No visual changes. ENT: No sore throat. Cardiovascular: Denies chest  pain. Respiratory: Denies shortness of breath. Gastrointestinal: No abdominal pain.  No nausea, no vomiting.  No diarrhea.  No constipation. Genitourinary: Negative for dysuria. Musculoskeletal: Left anterior chest wall pain. Skin: Negative for rash. Neurological: Negative for headaches, focal weakness or numbness.   ____________________________________________   PHYSICAL EXAM:  VITAL SIGNS: ED Triage Vitals  Enc Vitals Group     BP 11/24/20 0829 133/76     Pulse Rate 11/24/20 0829 93     Resp 11/24/20 0829 16     Temp 11/24/20 0829 98.6 F (37 C)     Temp Source 11/24/20 0829 Oral     SpO2 11/24/20 0829 97 %     Weight 11/24/20 0828 235 lb (106.6 kg)     Height 11/24/20 0828 6' (1.829 m)     Head Circumference --      Peak Flow --      Pain Score 11/24/20 0828 0     Pain Loc --      Pain Edu? --      Excl. in GC? --     Constitutional: Alert and oriented. Well appearing and in no acute distress. Eyes: Conjunctivae are normal. PERRL. EOMI. Head: Atraumatic. Nose: No congestion/rhinnorhea. Mouth/Throat: Mucous membranes are moist.  Oropharynx non-erythematous. Neck: No stridor. . Cardiovascular: Normal rate, regular rhythm. Grossly normal heart sounds.  Good peripheral circulation. Respiratory: Normal respiratory effort.  No retractions. Lungs CTAB. Gastrointestinal: Soft and nontender. No distention. No  abdominal bruits. No CVA tenderness. Genitourinary: Deferred Neurologic:  Normal speech and language. No gross focal neurologic deficits are appreciated. No gait instability. Skin:  Skin is warm, dry and intact. No rash noted. Psychiatric: Mood and affect are normal. Speech and behavior are normal.  ____________________________________________   LABS (all labs ordered are listed, but only abnormal results are displayed)  Labs Reviewed - No data to display ____________________________________________  EKG EKG read by heart station Dr. With no acute  findings.  ____________________________________________  RADIOLOGY Margarite Gouge, personally viewed and evaluated these images (plain radiographs) as part of my medical decision making, as well as reviewing the written report by the radiologist.  ED MD interpretation: No acute findings on chest x-ray. Official radiology report(s): DG Chest 2 View  Result Date: 11/24/2020 CLINICAL DATA:  30 year old male with left chest and chest wall pain. Pain with movement and deep breathing. EXAM: CHEST - 2 VIEW COMPARISON:  Portable chest 05/27/2019. FINDINGS: PA and lateral views. Lung volumes and mediastinal contours are within normal limits. Visualized tracheal air column is within normal limits. Both lungs appear clear. No pneumothorax or pleural effusion. Negative visible bowel gas pattern. No osseous abnormality identified. IMPRESSION: Negative.  No explanation for left chest pain. Electronically Signed   By: Odessa Fleming M.D.   On: 11/24/2020 09:13    ____________________________________________   PROCEDURES  Procedure(s) performed (including Critical Care):  Procedures   ____________________________________________   INITIAL IMPRESSION / ASSESSMENT AND PLAN / ED COURSE  As part of my medical decision making, I reviewed the following data within the electronic MEDICAL RECORD NUMBER         Patient presents with left anterior chest wall pain.  Discussed no acute findings on EKG chest x-ray.  Patient had moderate improvement status post IM injection of Norflex and Toradol.  Patient complaint physical exam consistent with muscle strain of the chest wall.  Patient given discharge care instruction advised take medication as directed.  Patient [facility]  Return to ED if condition worsens.     ____________________________________________   FINAL CLINICAL IMPRESSION(S) / ED DIAGNOSES  Final diagnoses:  Muscle strain of chest wall, initial encounter      ED Discharge Orders         Ordered    orphenadrine (NORFLEX) 100 MG tablet  2 times daily        11/24/20 1045    naproxen (NAPROSYN) 500 MG tablet  2 times daily with meals        11/24/20 1045    traMADol (ULTRAM) 50 MG tablet  Every 6 hours PRN        11/24/20 1045          *Please note:  [name] was evaluated in Emergency Department on 11/24/2020 for the symptoms described in the history of present illness. He was evaluated in the context of the global COVID-19 pandemic, which necessitated consideration that the patient might be at risk for infection with the SARS-CoV-2 virus that causes COVID-19. Institutional protocols and algorithms that pertain to the evaluation of patients at risk for COVID-19 are in a state of rapid change based on information released by regulatory bodies including the CDC and federal and state organizations. These policies and algorithms were followed during the patient's care in the ED.  Some ED evaluations and interventions may be delayed as a result of limited staffing during and the pandemic.*   Note:  This document was prepared using  Dragon [employer] and may include unintentional dictation errors.    Joni Reining, PA-C 11/24/20 1048    Jene Every, MD 11/24/20 1105

## 2020-11-24 NOTE — ED Notes (Signed)
See triage note  Presents with some sharp chest discomfort that started last pm  Pain is worse with movement and inspiration  No fever or cough

## 2020-11-24 NOTE — ED Triage Notes (Signed)
Pt c/o pain to the left side of chest with movement and deep breathing, states he does a lot of heavy lifting and work and may have pulled something.

## 2020-11-24 NOTE — Discharge Instructions (Signed)
No acute findings on x-ray of the chest.  Follow discharge care instruction take medication as directed.

## 2020-11-26 ENCOUNTER — Telehealth: Payer: Self-pay

## 2020-11-26 NOTE — Telephone Encounter (Signed)
After receiving an ER referral, called pt and mailed them an application.   MD 11/26/20 @ 1:10 pm

## 2021-07-06 ENCOUNTER — Emergency Department
Admission: EM | Admit: 2021-07-06 | Discharge: 2021-07-06 | Disposition: A | Discharge status: Home / Self Care | Payer: 59 | Attending: Emergency Medicine | Admitting: Emergency Medicine

## 2021-07-06 ENCOUNTER — Other Ambulatory Visit: Payer: Self-pay

## 2021-07-06 ENCOUNTER — Emergency Department: Payer: 59

## 2021-07-06 DIAGNOSIS — F17290 Nicotine dependence, other tobacco product, uncomplicated: Secondary | ICD-10-CM | POA: Diagnosis not present

## 2021-07-06 DIAGNOSIS — R52 Pain, unspecified: Secondary | ICD-10-CM

## 2021-07-06 DIAGNOSIS — S4991XA Unspecified injury of right shoulder and upper arm, initial encounter: Secondary | ICD-10-CM | POA: Diagnosis present

## 2021-07-06 DIAGNOSIS — Y9389 Activity, other specified: Secondary | ICD-10-CM | POA: Diagnosis not present

## 2021-07-06 DIAGNOSIS — Y92410 Unspecified street and highway as the place of occurrence of the external cause: Secondary | ICD-10-CM | POA: Diagnosis not present

## 2021-07-06 DIAGNOSIS — S40011A Contusion of right shoulder, initial encounter: Secondary | ICD-10-CM

## 2021-07-06 MED ORDER — CYCLOBENZAPRINE HCL 5 MG PO TABS: 5.0000 mg | ORAL_TABLET | Freq: Three times a day (TID) | ORAL | 0 refills | Status: AC | PRN

## 2021-07-06 MED ORDER — IBUPROFEN 600 MG PO TABS: 600.0000 mg | ORAL_TABLET | Freq: Four times a day (QID) | ORAL | 0 refills | Status: AC | PRN

## 2021-07-06 NOTE — Discharge Instructions (Signed)
Please apply ice to the right shoulder.  Work on gentle shoulder range of motion exercises.  You may alternate Tylenol and ibuprofen for mild to moderate pain, use Flexeril as needed for muscle tightness.  Call orthopedic office and 1 week if no improvement.  Return to the ER for any worsening symptoms or urgent changes in health.
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
## 2021-12-28 IMAGING — CR DG CHEST 2V
1 series · 2 of 2 positions shown · non-contrast
Comparison: Portable chest 05/27/2019.

CLINICAL DATA: 30-year-old male with left chest and chest wall
pain. Pain with movement and deep breathing.

EXAM:
CHEST - 2 VIEW

[Series 1: w chest pa · 0.14mm/px · 2 of 2 slices shown]
[im 1/2]
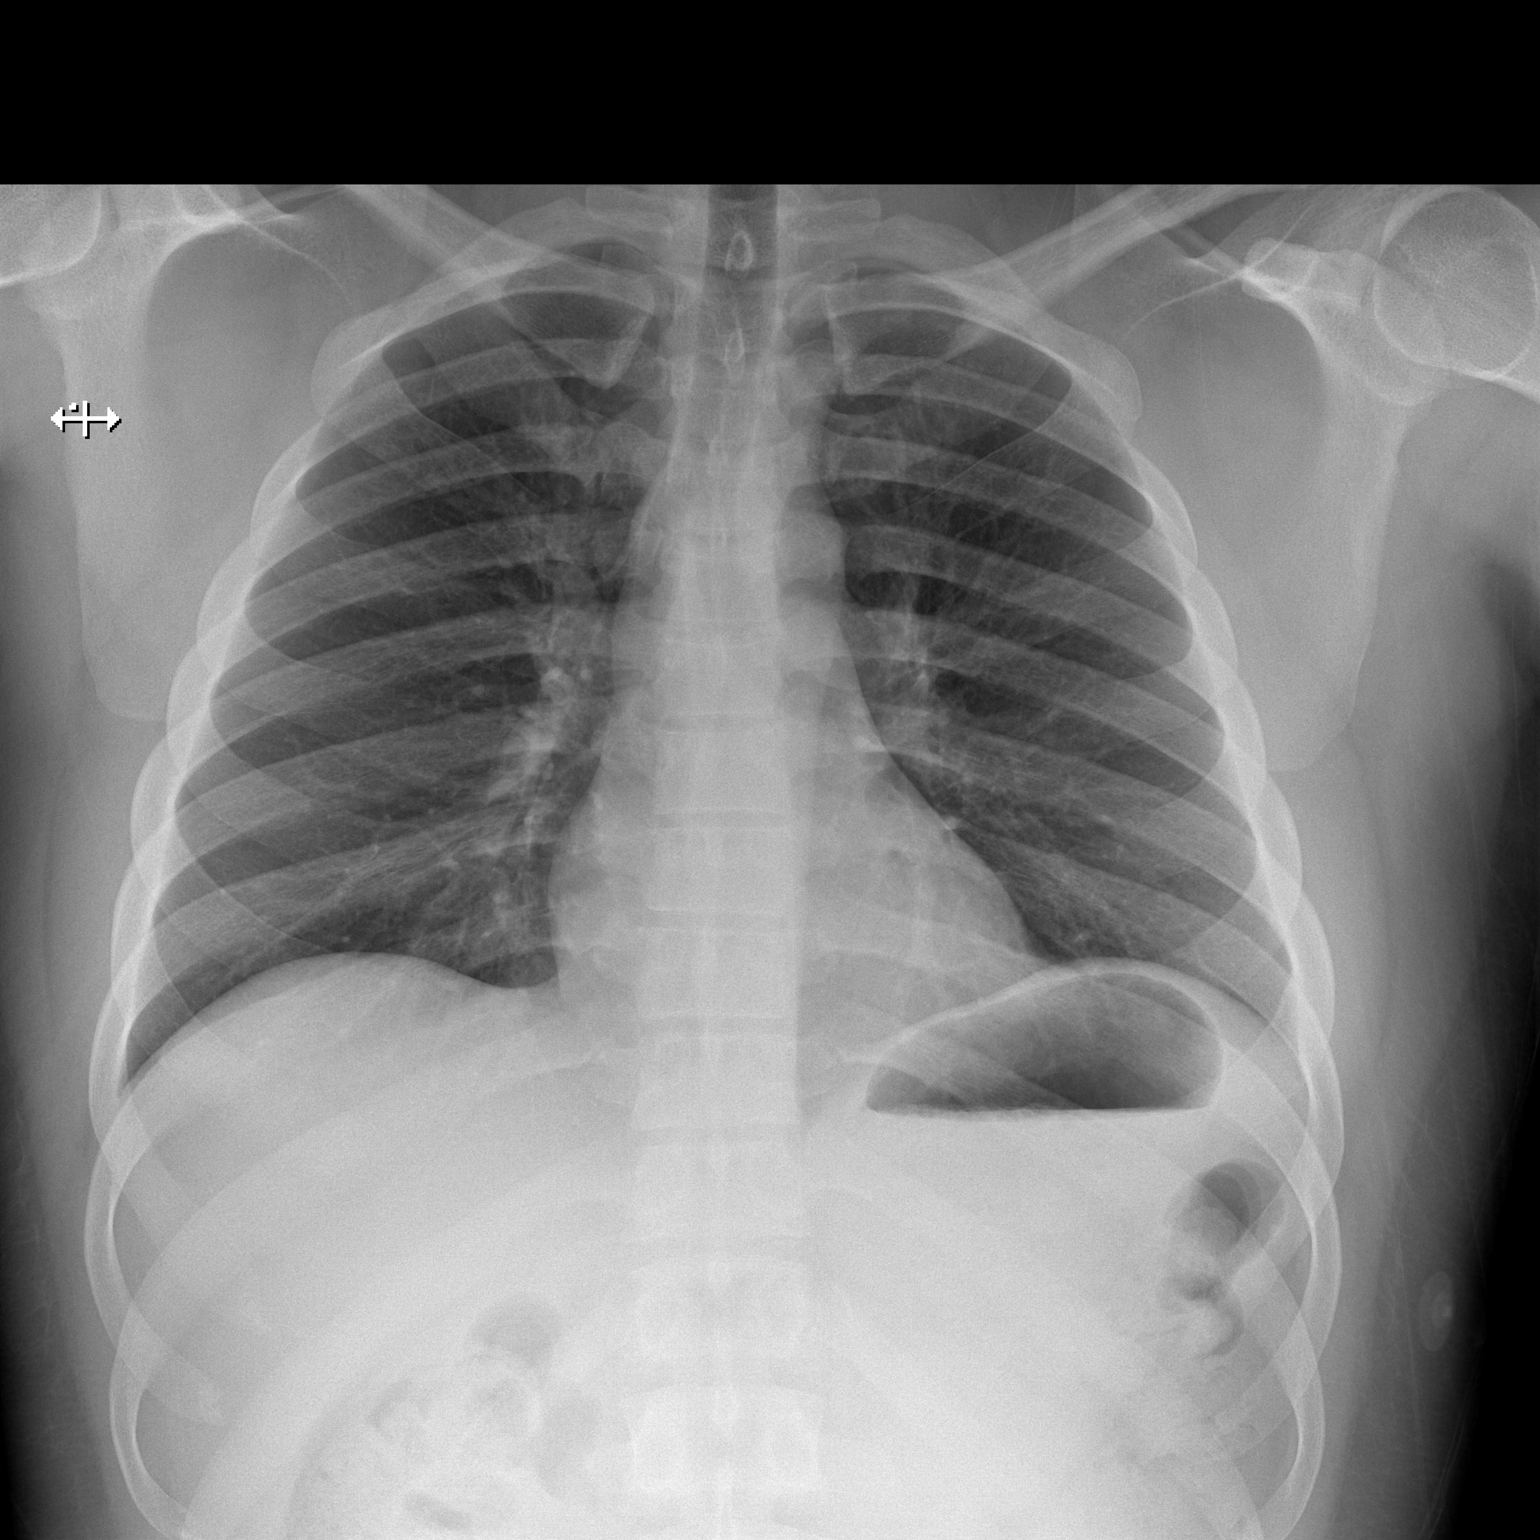
[im 2/2]
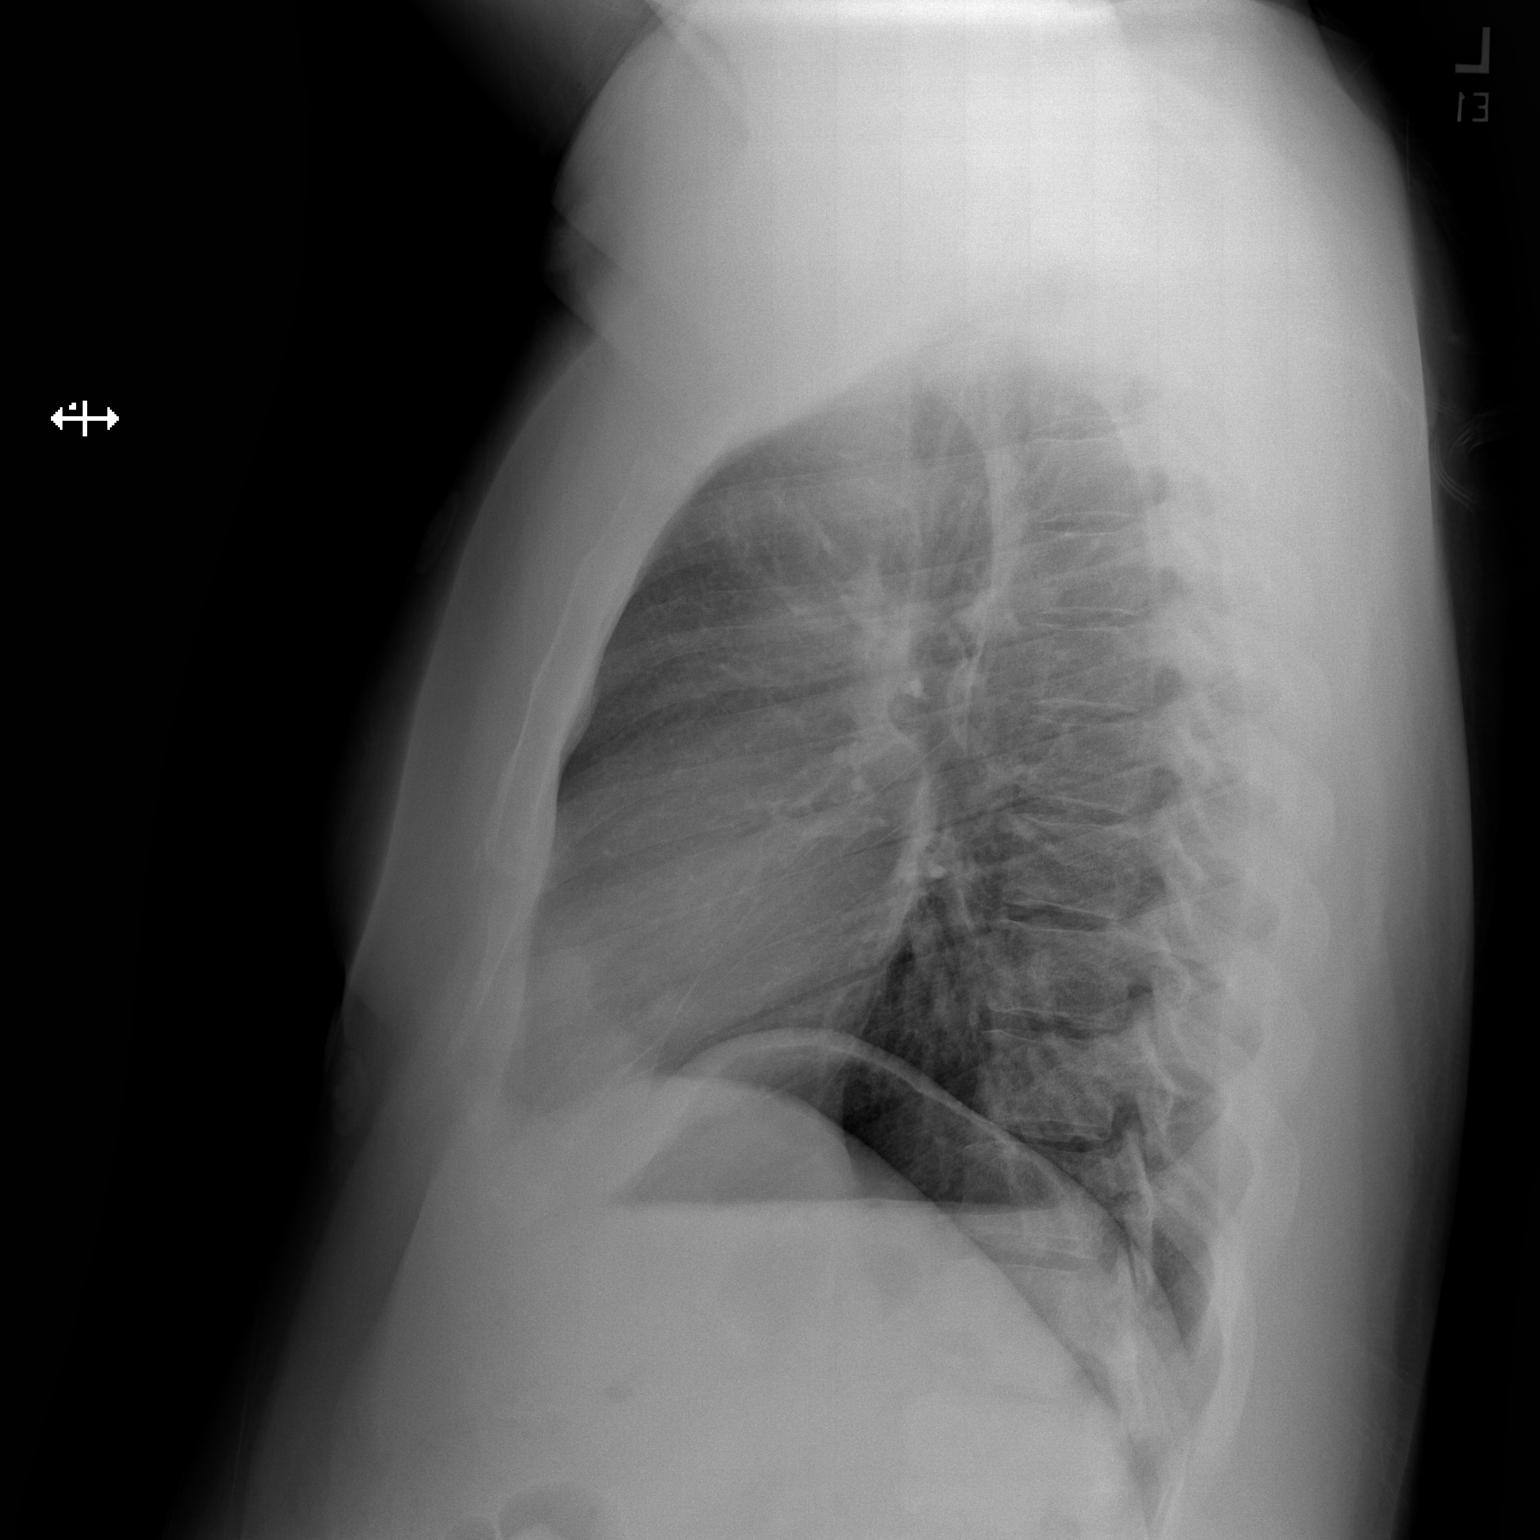

[2 of 2 positions shown; findings below may reference images not displayed]

FINDINGS: PA and lateral views. Lung volumes and mediastinal contours are
within normal limits. Visualized tracheal air column is within
normal limits. Both lungs appear clear. No pneumothorax or pleural
effusion. Negative visible bowel gas pattern. No osseous abnormality
identified.
IMPRESSION: Negative.  No explanation for left chest pain.
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
## 2023-04-14 ENCOUNTER — Other Ambulatory Visit: Payer: Self-pay

## 2023-04-14 ENCOUNTER — Emergency Department
Admission: EM | Admit: 2023-04-14 | Discharge: 2023-04-14 | Disposition: A | Discharge status: Home / Self Care | Payer: Medicaid Other | Attending: Emergency Medicine | Admitting: Emergency Medicine

## 2023-04-14 DIAGNOSIS — U071 COVID-19: Secondary | ICD-10-CM | POA: Diagnosis not present

## 2023-04-14 LAB — SARS CORONAVIRUS 2 BY RT PCR: SARS Coronavirus 2 by RT PCR: POSITIVE — AB

## 2023-04-14 NOTE — Discharge Instructions (Addendum)
Your COVID test is positive.   Please go to the following website to schedule new (and existing) patient appointments:   [email or web address]   The following is a list of primary care offices in the area who are accepting new patients at this time.  Please reach out to one of them directly and let them know you would like to schedule an appointment to follow up on an Emergency Department visit, and/or to establish a new primary care provider (PCP).  There are likely other primary care clinics in the area who are accepting new patients, but this is an excellent place to start:  [facility] Lead physician: Dr Shirlee Latch [address] [place], [place] [address] [phone]  [facility] Lead Physician: Dr Alba Cory [address], [place], [place] [address] [phone]  [facility]  Lead Physician: Dr Olevia Perches [address] [place], [place] [address] [phone]  [facility] Lead Physician: Dr Sofie Hartigan [address], [place], [place] [address] [phone]  [facility] Lead Physician: Dr Bari Edward [address] [place], [place], [place] [address] [phone]

## 2023-04-14 NOTE — ED Provider Notes (Signed)
[facility] Emergency Department Provider Note     Event Date/Time   First MD Initiated Contact with Patient 04/14/23 1450     (approximate)   History   Medical Clearance   HPI  Auguste Khim is a 32 y.o. male with no significant past medical history who presents to the ED for a work note and medical clearance.  Patient reports he was tested positive for COVID on Saturday and would like a repeat COVID test.  Patient denies any symptoms at this time.  Denies fever, shortness of breath and chest pain.   Physical Exam   Triage Vital Signs: ED Triage Vitals [04/14/23 1333]  Encounter Vitals Group     BP (!) 161/94     Systolic BP Percentile      Diastolic BP Percentile      Pulse Rate 72     Resp (!) 116     Temp 98.3 F (36.8 C)     Temp Source Oral     SpO2 100 %     Weight 245 lb (111.1 kg)     Height 6' (1.829 m)     Head Circumference      Peak Flow      Pain Score 0     Pain Loc      Pain Education      Exclude from Growth Chart     Most recent vital signs: Vitals:   04/14/23 1333 04/14/23 1445  BP: (!) 161/94 (!) 150/92  Pulse: 72 68  Resp: (!) 116 18  Temp: 98.3 F (36.8 C)   SpO2: 100% 99%    General Well-appearing.  Awake, no distress.  HEENT NCAT. PERRL. EOMI. No rhinorrhea. Mucous membranes are moist.  CV:  Good peripheral perfusion.  RRR RESP:  Normal effort.  LCTAB. No wheezes or rhonchi.  ABD:  No distention.   ED Results / Procedures / Treatments   Labs (all labs ordered are listed, but only abnormal results are displayed) Labs Reviewed  SARS CORONAVIRUS 2 BY RT PCR - Abnormal; Notable for the following components:      Result Value   SARS Coronavirus 2 by RT PCR POSITIVE (*)    All other components within normal limits    No results found.  PROCEDURES:  Critical Care performed: No  Procedures   MEDICATIONS ORDERED IN ED: Medications - No data to display   IMPRESSION / MDM / ASSESSMENT AND PLAN /  ED COURSE  I reviewed the triage vital signs and the nursing notes.                               32 y.o. male presents to the emergency department for reassurance and a COVID 19 test. See HPI for further details.   Differential diagnosis includes, but is not limited to COVID, URI, viral illness.  Patient's presentation is most consistent with acute complicated illness / injury requiring diagnostic workup.  Patients presentation is well-appearing and nontoxic. He is hemodynamically stable. COVID test is positive. Education on viral pathology is provided along with ED precaution to return to the ED for any worsening or new symptoms. Patient verbalizes understanding. Work note provided. All questions and concerns were addressed during ED visit.     FINAL CLINICAL IMPRESSION(S) / ED DIAGNOSES   Final diagnoses:  COVID-19   Rx / DC Orders   ED Discharge Orders  None        Note:  This document was prepared using Dragon voice recognition software and may include unintentional dictation errors.    Romeo Apple, Shandy Checo A, PA-C 04/14/23 2312    Minna Antis, MD 04/19/23 1534

## 2023-04-14 NOTE — ED Triage Notes (Signed)
Pt here for medical clearance. Pt states covid has been going around his work place and pt believes he may have it. Pt needs a work note to return to work.
